# Patient Record
Sex: Female | Born: 1965
Health system: Southern US, Community
[De-identification: ages and names within clinical notes are randomized; demographics above are authoritative.]

## PROBLEM LIST (undated history)

## (undated) DIAGNOSIS — I10 Essential (primary) hypertension: Secondary | ICD-10-CM

## (undated) DIAGNOSIS — F419 Anxiety disorder, unspecified: Secondary | ICD-10-CM

## (undated) DIAGNOSIS — E282 Polycystic ovarian syndrome: Secondary | ICD-10-CM

## (undated) DIAGNOSIS — E669 Obesity, unspecified: Secondary | ICD-10-CM

## (undated) HISTORY — DX: Anxiety disorder, unspecified: F41.9

## (undated) HISTORY — DX: Polycystic ovarian syndrome: E28.2

## (undated) HISTORY — PX: ECTOPIC PREGNANCY SURGERY: SHX613

## (undated) HISTORY — DX: Obesity, unspecified: E66.9

## (undated) HISTORY — DX: Essential (primary) hypertension: I10

---

## 1997-08-05 DIAGNOSIS — E282 Polycystic ovarian syndrome: Secondary | ICD-10-CM

## 1997-08-05 HISTORY — DX: Polycystic ovarian syndrome: E28.2

## 2003-01-18 ENCOUNTER — Emergency Department (HOSPITAL_COMMUNITY): Admission: EM | Admit: 2003-01-18 | Discharge: 2003-01-18 | Payer: Self-pay | Admitting: Emergency Medicine

## 2004-09-05 ENCOUNTER — Ambulatory Visit: Payer: Self-pay | Admitting: Family Medicine

## 2004-11-28 ENCOUNTER — Other Ambulatory Visit: Admission: RE | Admit: 2004-11-28 | Discharge: 2004-11-28 | Payer: Self-pay | Admitting: Obstetrics and Gynecology

## 2005-01-21 ENCOUNTER — Ambulatory Visit (HOSPITAL_COMMUNITY): Admission: RE | Admit: 2005-01-21 | Discharge: 2005-01-21 | Payer: Self-pay | Admitting: Obstetrics and Gynecology

## 2006-02-13 ENCOUNTER — Other Ambulatory Visit: Admission: RE | Admit: 2006-02-13 | Discharge: 2006-02-13 | Payer: Self-pay | Admitting: Obstetrics and Gynecology

## 2007-08-06 ENCOUNTER — Encounter: Payer: Self-pay | Admitting: Physician Assistant

## 2008-06-27 ENCOUNTER — Observation Stay (HOSPITAL_COMMUNITY): Admission: EM | Admit: 2008-06-27 | Discharge: 2008-06-28 | Payer: Self-pay | Admitting: Emergency Medicine

## 2009-10-05 ENCOUNTER — Ambulatory Visit: Payer: Self-pay | Admitting: Family Medicine

## 2009-10-05 DIAGNOSIS — E669 Obesity, unspecified: Secondary | ICD-10-CM | POA: Insufficient documentation

## 2009-10-05 DIAGNOSIS — F438 Other reactions to severe stress: Secondary | ICD-10-CM

## 2009-10-05 DIAGNOSIS — I10 Essential (primary) hypertension: Secondary | ICD-10-CM | POA: Insufficient documentation

## 2009-10-09 ENCOUNTER — Telehealth: Payer: Self-pay | Admitting: Physician Assistant

## 2009-10-21 IMAGING — CR DG CHEST 2V
2 series · 2 of 2 positions shown · non-contrast
Comparison: No priors

CLINICAL DATA: Chest pain

CHEST - 2 VIEW

[w chest pa]
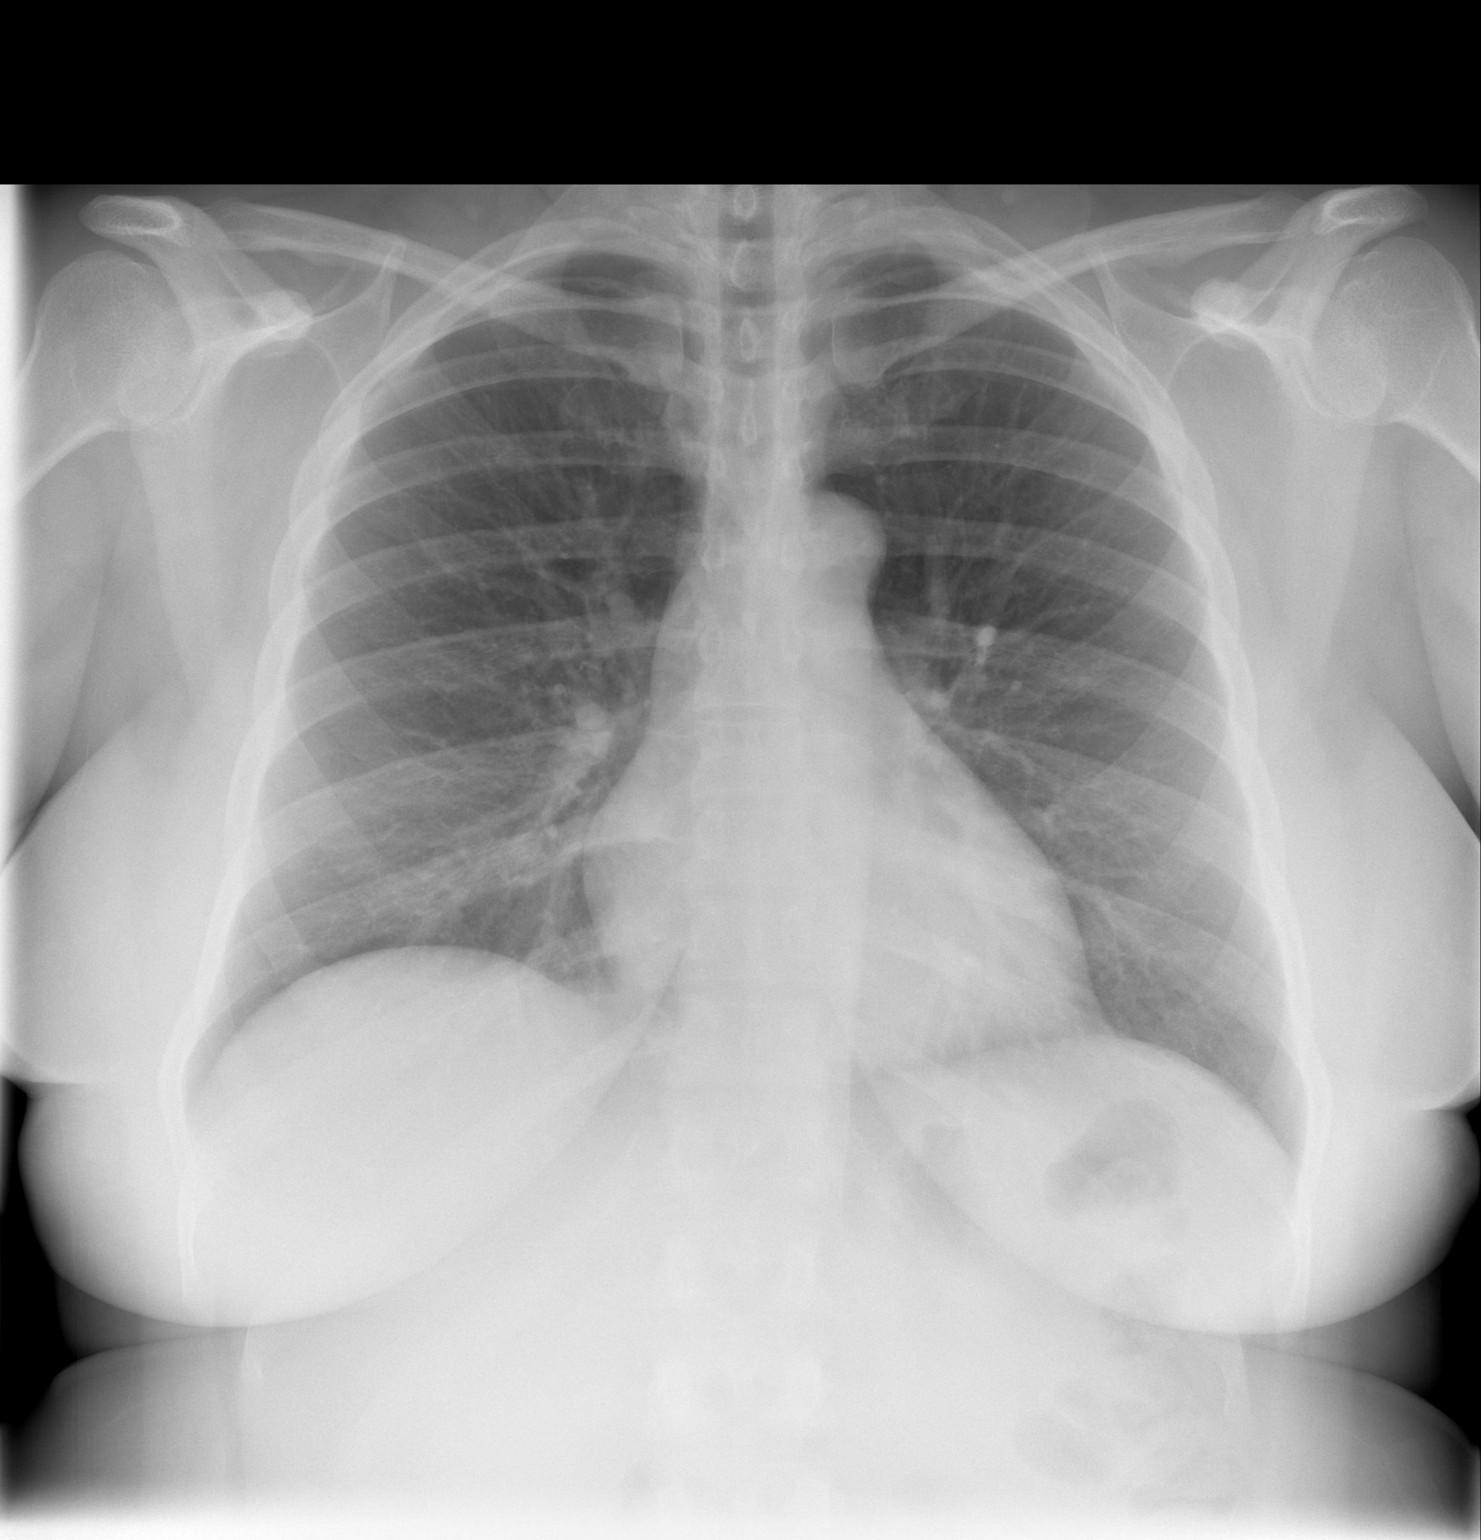

[w chest lat]
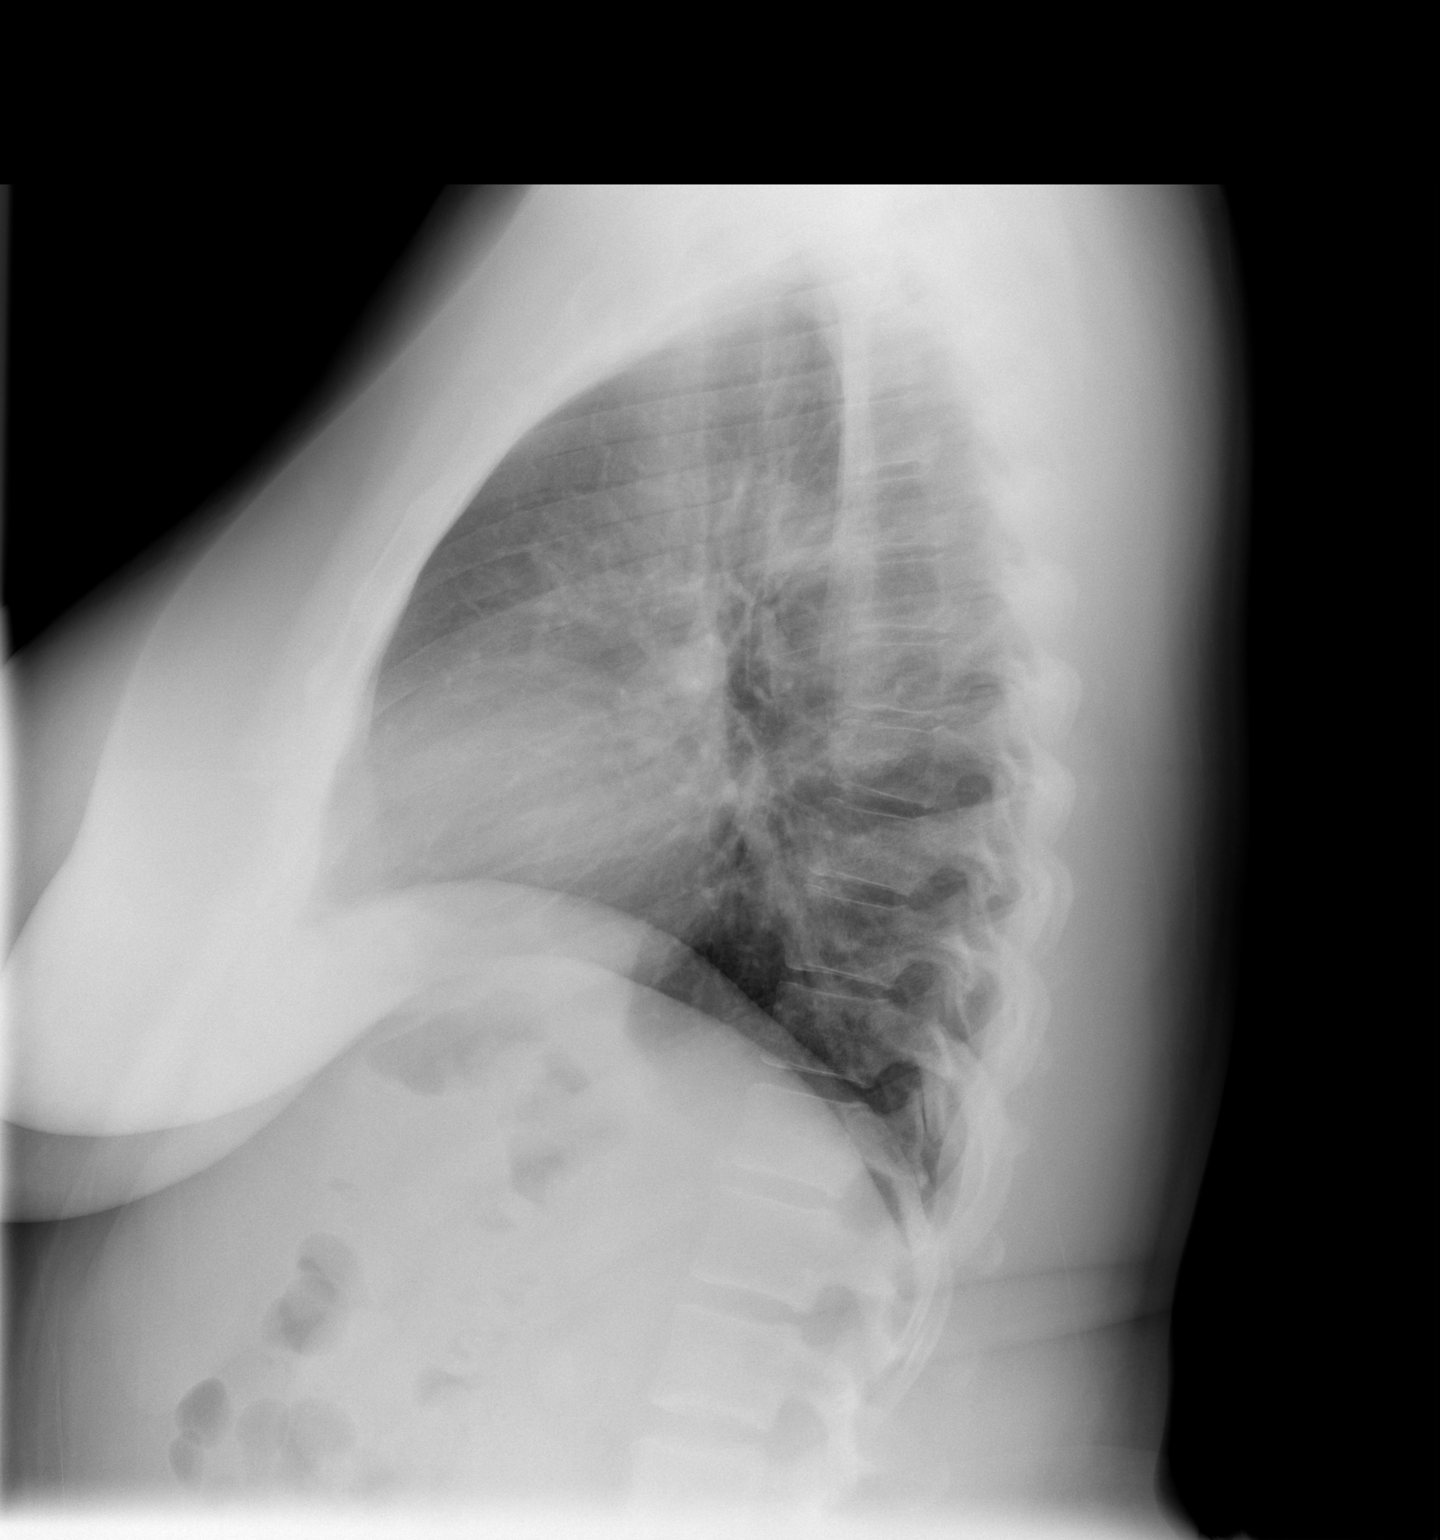

[2 of 2 positions shown; findings below may reference images not displayed]

FINDINGS: The Heart and mediastinal contours normal.  Lungs clear.
No pleural fluid.  Osseous structures and soft tissues
unremarkable.
IMPRESSION: No active disease.

## 2009-11-08 ENCOUNTER — Encounter: Payer: Self-pay | Admitting: Physician Assistant

## 2009-11-16 ENCOUNTER — Ambulatory Visit: Payer: Self-pay | Admitting: Family Medicine

## 2009-11-23 ENCOUNTER — Ambulatory Visit (HOSPITAL_COMMUNITY): Admission: RE | Admit: 2009-11-23 | Discharge: 2009-11-23 | Payer: Self-pay | Admitting: Obstetrics and Gynecology

## 2009-12-04 ENCOUNTER — Ambulatory Visit: Payer: Self-pay | Admitting: Family Medicine

## 2009-12-04 DIAGNOSIS — L255 Unspecified contact dermatitis due to plants, except food: Secondary | ICD-10-CM | POA: Insufficient documentation

## 2009-12-04 DIAGNOSIS — R609 Edema, unspecified: Secondary | ICD-10-CM | POA: Insufficient documentation

## 2010-03-26 ENCOUNTER — Ambulatory Visit: Payer: Self-pay | Admitting: Family Medicine

## 2010-04-04 ENCOUNTER — Telehealth: Payer: Self-pay | Admitting: Family Medicine

## 2010-08-26 ENCOUNTER — Encounter: Payer: Self-pay | Admitting: Obstetrics and Gynecology

## 2010-09-04 NOTE — Progress Notes (Signed)
Summary: OBGYN  OBGYN   Imported By: Lind Guest 11/28/2009 13:18:48  _____________________________________________________________________  External Attachment:    Type:   Image     Comment:   External Document

## 2010-09-04 NOTE — Assessment & Plan Note (Signed)
Summary: foot swelling- room 1   Vital Signs:  Patient profile:   44 year old female Height:      65 inches Weight:      221.75 pounds BMI:     37.03 O2 Sat:      98 % on Room air Pulse rate:   99 / minute Resp:     16 per minute BP sitting:   130 / 82  (left arm)  Vitals Entered By: Adella Hare LPN (March 26, 2010 9:06 AM)  Nutrition Counseling: Patient's BMI is greater than 25 and therefore counseled on weight management options. CC: left foot swelling off and on Comments did not bring meds to ov   CC:  left foot swelling off and on.  History of Present Illness: Pt presents today with c/o swelling bilat LE x 2-3 weeks.  Worse LLE than Rt.  Notices at end of day and is improved in the morning.  She has been trying to cut back on her salt use, but states she has been using alot of seasoning packets.  She denies prolonged sitting or standing.  No redness or pain.  Hx of htn. Taking medications as prescribed. No headache, chest pain or palpitations.  Pt has been trying to conceive.  This is on hold for approx 6 mos due to health problems with her husband.  She also asks if she can have a prescription to help with wt loss.  She had lost 5 lbs but has put most of it back on.  Was walking daily for exercise, but her walking partner quit so she did too.      Allergies (verified): No Known Drug Allergies  Past History:  Past medical history reviewed for relevance to current acute and chronic problems.  Past Medical History: Reviewed history from 10/05/2009 and no changes required. Hypertension Obesity Infertility PMH reviewed for relevance  Review of Systems General:  Denies chills and fever. CV:  Complains of swelling of feet; denies chest pain or discomfort and palpitations. Resp:  Denies shortness of breath. MS:  Denies joint pain and joint redness. Neuro:  Denies headaches.  Physical Exam  General:  Well-developed,well-nourished,in no acute distress;  alert,appropriate and cooperative throughout examination Head:  Normocephalic and atraumatic without obvious abnormalities. No apparent alopecia or balding. Ears:  External ear exam shows no significant lesions or deformities.  Otoscopic examination reveals clear canals, tympanic membranes are intact bilaterally without bulging, retraction, inflammation or discharge. Hearing is grossly normal bilaterally. Nose:  External nasal examination shows no deformity or inflammation. Nasal mucosa are pink and moist without lesions or exudates. Mouth:  Oral mucosa and oropharynx without lesions or exudates.  Teeth in good repair. Neck:  No deformities, masses, or tenderness noted. Lungs:  Normal respiratory effort, chest expands symmetrically. Lungs are clear to auscultation, no crackles or wheezes. Heart:  Normal rate and regular rhythm. S1 and S2 normal without gallop, murmur, click, rub or other extra sounds. Pulses:  R posterior tibial normal, R dorsalis pedis normal, L posterior tibial normal, and L dorsalis pedis normal.   Extremities:  No PTE Cervical Nodes:  No lymphadenopathy noted Psych:  Cognition and judgment appear intact. Alert and cooperative with normal attention span and concentration. No apparent delusions, illusions, hallucinations   Impression & Recommendations:  Problem # 1:  PERIPHERAL EDEMA (ICD-782.3) Assessment New Dependent edema.  Discussed with pt to check sodium content of the seasoning pkts she is using. Increase water intake, elevate legs etc. Offered prescription for  support stockings but pt declined.  Her updated medication list for this problem includes:    Furosemide 20 Mg Tabs (Furosemide) .Marland Kitchen... Take 1 in the morning as needed for swellling  Problem # 2:  HYPERTENSION (ICD-401.9) Assessment: Comment Only  Her updated medication list for this problem includes:    Procardia Xl 60 Mg Xr24h-tab (Nifedipine) ..... One tab by mouth once daily d/c lotrel    Furosemide  20 Mg Tabs (Furosemide) .Marland Kitchen... Take 1 in the morning as needed for swellling  BP today: 130/82 Prior BP: 120/80 (12/04/2009)  Problem # 3:  OBESITY (ICD-278.00) Assessment: Comment Only  Ht: 65 (03/26/2010)   Wt: 221.75 (03/26/2010)   BMI: 37.03 (03/26/2010)  Complete Medication List: 1)  Procardia Xl 60 Mg Xr24h-tab (Nifedipine) .... One tab by mouth once daily d/c lotrel 2)  Prenavite Multiple Vitamin 28-0.8 Mg Tabs (Prenatal vit-fe fumarate-fa) .... One tab by mouth once daily 3)  Furosemide 20 Mg Tabs (Furosemide) .... Take 1 in the morning as needed for swellling 4)  Phentermine Hcl 37.5 Mg Caps (Phentermine hcl) .... Take 1 daily 5)  Metformin Hcl 500 Mg Tabs (Metformin hcl) .... Take 1/2 tab tid  Patient Instructions: 1)  Please schedule a follow-up appointment in 1 month. 2)  It is important that you exercise regularly at least 20 minutes 5 times a week. If you develop chest pain, have severe difficulty breathing, or feel very tired , stop exercising immediately and seek medical attention. 3)  You need to lose weight. Consider a lower calorie diet and regular exercise.  4)  Continue condoms.  You should not take Phentermine once you start trying to conceive. Prescriptions: PHENTERMINE HCL 37.5 MG CAPS (PHENTERMINE HCL) take 1 daily  #30 x 0   Entered and Authorized by:   Esperanza Sheets PA   Signed by:   Esperanza Sheets PA on 03/26/2010   Method used:   Printed then faxed to ...       [facility] (retail)       [address]       [place], [place]  [address]       Ph: [phone]       Fax: [phone]   RxID:   [id number] FUROSEMIDE 20 MG TABS (FUROSEMIDE) take 1 in the morning as needed for swellling  #30 x 0   Entered and Authorized by:   Esperanza Sheets PA   Signed by:   Esperanza Sheets PA on 03/26/2010   Method used:   Electronically to        [facility] (retail)       [address]       [place], [place]  [address]       Ph: [phone]       Fax: [phone]   RxID:   [id number]

## 2010-09-04 NOTE — Assessment & Plan Note (Signed)
Summary: follow up- room 3   Vital Signs:  Patient profile:   45 year old female Height:      65 inches Weight:      220 pounds BMI:     36.74 O2 Sat:      98 % on Room air Pulse rate:   97 / minute Resp:     16 per minute BP sitting:   120 / 70  (left arm)  Vitals Entered By: Adella Hare LPN (November 16, 2009 8:59 AM)  Nutrition Counseling: Patient's BMI is greater than 25 and therefore counseled on weight management options. CC: follow-up visit Is Patient Diabetic? No Pain Assessment Patient in pain? no        CC:  follow-up visit.  History of Present Illness: Hx of htn. Taking medications as prescribed. Recently changed BP meds to Nifedipine. No headache, chest pain or palpitations. Walking 30 - 45 mins every morning since last visit.  Hx of anxiety.  States is currently doing well with this & is controlled.  No meds.  Saw gynecologist recently.  Had pap/physical done.  Is being referred to infertility specialist.  Has been trying to conceive.  Pt states she had labs done thru GYN. Gyn prescribed prenatal vits.  No eye exam x yrs + dental care Uncertain of last Tetnus.   Current Medications (verified): 1)  Procardia Xl 60 Mg Xr24h-Tab (Nifedipine) .... One Tab By Mouth Once Daily D/c Lotrel 2)  Prenavite Multiple Vitamin 28-0.8 Mg Tabs (Prenatal Vit-Fe Fumarate-Fa) .... One Tab By Mouth Once Daily  Allergies (verified): No Known Drug Allergies  Past History:  Past medical, surgical, family and social histories (including risk factors) reviewed for relevance to current acute and chronic problems.  Past Medical History: Reviewed history from 10/05/2009 and no changes required. Hypertension Obesity Infertility  Past Surgical History: Reviewed history from 10/05/2009 and no changes required. Tubal pregnancy 1980's Oral extractions  Family History: Reviewed history from 10/05/2009 and no changes required. mother living- COPD, DM, CHF father living- HTN,  gout brother living- presumed healthy  Social History: Reviewed history from 10/05/2009 and no changes required. Unemployed Married- 13 years no children Never Smoked Alcohol use-no Drug use-no Regular exercise-no  Review of Systems General:  Denies fatigue. CV:  Denies chest pain or discomfort and palpitations. Resp:  Denies shortness of breath. GI:  Complains of indigestion; denies change in bowel habits, nausea, and vomiting; INDIGESTION WITH CERTAIN FOODS.  Physical Exam  General:  Well-developed,well-nourished,in no acute distress; alert,appropriate and cooperative throughout examination Head:  Normocephalic and atraumatic without obvious abnormalities. No apparent alopecia or balding. Eyes:  pupils equal, pupils round, pupils reactive to light, pupils react to accomodation, corneas and lenses clear, no injection, no optic disk abnormalities, and no retinal abnormalitiies.   Ears:  External ear exam shows no significant lesions or deformities.  Otoscopic examination reveals clear canals, tympanic membranes are intact bilaterally without bulging, retraction, inflammation or discharge. Hearing is grossly normal bilaterally. Nose:  External nasal examination shows no deformity or inflammation. Nasal mucosa are pink and moist without lesions or exudates. Mouth:  Oral mucosa and oropharynx without lesions or exudates.  Teeth in good repair. Neck:  No deformities, masses, or tenderness noted. Lungs:  Normal respiratory effort, chest expands symmetrically. Lungs are clear to auscultation, no crackles or wheezes. Heart:  Normal rate and regular rhythm. S1 and S2 normal without gallop, murmur, click, rub or other extra sounds. Cervical Nodes:  No lymphadenopathy noted Psych:  Cognition  and judgment appear intact. Alert and cooperative with normal attention span and concentration. No apparent delusions, illusions, hallucinations   Impression & Recommendations:  Problem # 1:   HYPERTENSION (ICD-401.9) Assessment Improved Continue current meds.    Her updated medication list for this problem includes:    Procardia Xl 60 Mg Xr24h-tab (Nifedipine) ..... One tab by mouth once daily d/c lotrel  Problem # 2:  OBESITY (ICD-278.00) Assessment: Deteriorated Discussed healthy diet, & continue exercise. Diet h/o given.  Ht: 65 (11/16/2009)   Wt: 220 (11/16/2009)   BMI: 36.74 (11/16/2009)  Problem # 3:  ANXIETY, SITUATIONAL (ICD-308.3) Assessment: Improved  Complete Medication List: 1)  Procardia Xl 60 Mg Xr24h-tab (Nifedipine) .... One tab by mouth once daily d/c lotrel 2)  Prenavite Multiple Vitamin 28-0.8 Mg Tabs (Prenatal vit-fe fumarate-fa) .... One tab by mouth once daily  Other Orders: Tdap => 42yrs IM (09811) Admin 1st Vaccine (91478)  Patient Instructions: 1)  Please schedule a follow-up appointment in 4 months. 2)  It is important that you exercise regularly at least 20 minutes 5 times a week. If you develop chest pain, have severe difficulty breathing, or feel very tired , stop exercising immediately and seek medical attention. 3)  You need to lose weight. Consider a lower calorie diet and regular exercise.  4)  I recommend you schedule a routine eye exam. 5)  Continue your current blood pressure medication.  Your blood pressure is very good today. Prescriptions: PROCARDIA XL 60 MG XR24H-TAB (NIFEDIPINE) one tab by mouth once daily d/c lotrel  #30 x 3   Entered and Authorized by:   Esperanza Sheets PA   Signed by:   Esperanza Sheets PA on 11/16/2009   Method used:   Electronically to        [facility] (retail)       [address]       [place], [place]  [address]       Ph: [phone]       Fax: [phone]   RxID:   [id number]    Immunizations Administered:  Tetanus Vaccine:    Vaccine Type: Tdap    Site: left deltoid    Mfr: San[employer]i Pasteur    Dose: 0.5 ml    Route: IM    Given by: Adella Hare LPN     Exp. Date: 10/28/2011    Lot #: [id number]    VIS given: 06/23/07 version given November 17, 2009.

## 2010-09-04 NOTE — Progress Notes (Signed)
Summary: autherzation on meds  Phone Note Call from Patient   Summary of Call: pt says her rx needs preautherzation. pharm faxed it over.  [phone] Initial call taken by: Rudene Anda,  April 04, 2010 3:30 PM  Follow-up for Phone Call        patient aware that insurance does not pay for diet meds Follow-up by: Adella Hare LPN,  April 05, 2010 12:13 PM

## 2010-09-04 NOTE — Letter (Signed)
Summary: rpc chart  rpc chart   Imported By: Curtis Sites 05/07/2010 09:47:43  _____________________________________________________________________  External Attachment:    Type:   Image     Comment:   External Document

## 2010-09-04 NOTE — Assessment & Plan Note (Signed)
Summary: new patient - room 2   Vital Signs:  Patient profile:   45 year old female Height:      65 inches Weight:      215.25 pounds BMI:     35.95 O2 Sat:      98 % Pulse rate:   109 / minute Resp:     16 per minute BP sitting:   140 / 80  (left arm)  Vitals Entered By: Adella Hare LPN (October 05, 1608 9:17 AM)  Nutrition Counseling: Patient's BMI is greater than 25 and therefore counseled on weight management options.  Serial Vital Signs/Assessments:  Time      Position  BP       Pulse  Resp  Temp     By                     126/92                         Esperanza Sheets PA  CC: new patient Is Patient Diabetic? No Pain Assessment Patient in pain? no        CC:  new patient.  History of Present Illness: New pt here to establish care with a new PCP.  Had been out of ins for about 1 yr so hasn't had medical care during that time.  I am uncertain & she is unclear about who has been prescribing her BP meds.  She does state that she had been "stretching them out"  only taking them every few days up until recently.  She is taking them daily now.  She doesn't like taking 2 different pills though & would like her Rx changed so that she is taking only 1.  She denies chest pain, palp, or swelling.   Pt was having alot of anxiety issues. She states this was due to her previous job.  It was a very stressful environment.  Since she was layed off she has been doing well.  No feelings of depression, SI & is sleeping well.     Current Medications (verified): 1)  Amlodipine Besylate 5 Mg Tabs (Amlodipine Besylate) .... One Tab By Mouth Once Daily 2)  Hydrochlorothiazide 12.5 Mg Caps (Hydrochlorothiazide) .... One Cap By Mouth Once Daily  Allergies (verified): No Known Drug Allergies  Past History:  Past medical, surgical, family and social histories (including risk factors) reviewed for relevance to current acute and chronic problems.  Past Medical  History: Hypertension Obesity Infertility  Past Surgical History: Tubal pregnancy 1980's Oral extractions  Family History: Reviewed history and no changes required. mother living- COPD, DM, CHF father living- HTN, gout brother living- presumed healthy  Social History: Reviewed history and no changes required. Unemployed Married- 13 years no children Never Smoked Alcohol use-no Drug use-no Regular exercise-no Smoking Status:  never Drug Use:  no Does Patient Exercise:  no  Review of Systems General:  Denies chills and fever. Eyes:  Denies blurring and double vision. ENT:  Denies earache, sinus pressure, and sore throat. CV:  Denies chest pain or discomfort and swelling of feet. Resp:  Denies cough and shortness of breath. GI:  Denies indigestion, nausea, and vomiting. Psych:  Denies anxiety, depression, and panic attacks. Endo:  Denies excessive thirst and polyuria. Heme:  Denies enlarge lymph nodes and fevers.  Physical Exam  General:  Well-developed,well-nourished,in no acute distress; alert,appropriate and cooperative throughout examination Head:  Normocephalic and atraumatic without obvious abnormalities.  No apparent alopecia or balding. Ears:  External ear exam shows no significant lesions or deformities.  Otoscopic examination reveals clear canals, tympanic membranes are intact bilaterally without bulging, retraction, inflammation or discharge. Hearing is grossly normal bilaterally. Nose:  External nasal examination shows no deformity or inflammation. Nasal mucosa are pink and moist without lesions or exudates. Mouth:  Oral mucosa and oropharynx without lesions or exudates.  Teeth in good repair. Neck:  No deformities, masses, or tenderness noted. Lungs:  Normal respiratory effort, chest expands symmetrically. Lungs are clear to auscultation, no crackles or wheezes. Heart:  Normal rate and regular rhythm. S1 and S2 normal without gallop, murmur, click, rub or  other extra sounds. Abdomen:  Bowel sounds positive,abdomen soft and non-tender without masses, organomegaly or hernias noted. Extremities:  No clubbing, cyanosis, edema, or deformity noted with normal full range of motion of all joints.   Cervical Nodes:  No lymphadenopathy noted Psych:  Cognition and judgment appear intact. Alert and cooperative with normal attention span and concentration. No apparent delusions, illusions, hallucinations   Impression & Recommendations:  Problem # 1:  HYPERTENSION (ICD-401.9) Discussed with pt we could d/c the Hctz & increase her Amlodipine so that she would only have 1 pill a day.  Side effect risk of edema discussed. Pt does not want to do this, so Rx'd changed to Lotrel.  The following medications were removed from the medication list:    Amlodipine Besylate 5 Mg Tabs (Amlodipine besylate) ..... One tab by mouth once daily    Hydrochlorothiazide 12.5 Mg Caps (Hydrochlorothiazide) ..... One cap by mouth once daily Her updated medication list for this problem includes:    Lotrel 5-10 Mg Caps (Amlodipine besy-benazepril hcl) .Marland Kitchen... 1 daily  Orders: T-Comprehensive Metabolic Panel [id number]) T-Lipid Profile [id number]) T-CBC No Diff ([id number])  Problem # 2:  OBESITY (ICD-278.00)  Wt: 215.25 (10/05/2009)     Orders: T-Comprehensive Metabolic Panel ([id number]) T-Lipid Profile ([id number]) T-TSH ([id number])  Problem # 3:  ANXIETY, SITUATIONAL (ICD-308.3) Assessment: Improved Hx of, no current probs with.  Complete Medication List: 1)  Lotrel 5-10 Mg Caps (Amlodipine besy-benazepril hcl) .Marland Kitchen.. 1 daily  Other Orders: T-Vitamin D (25-Hydroxy) ([id number])  Patient Instructions: 1)  Schedule appt in 4-6 weeks for physical/pap. 2)  I have ordered  blood work for you.  Please have done fasting. 3)  I have changed your blood pressure pills.  discontinue taking amlodipine & hctz.  You will now just be taking Lotrel. 4)  It is important  that you exercise regularly at least 20 minutes 5 times a week. If you develop chest pain, have severe difficulty breathing, or feel very tired , stop exercising immediately and seek medical attention. 5)  You need to lose weight. Consider a lower calorie diet and regular exercise.  Prescriptions: LOTREL 5-10 MG CAPS (AMLODIPINE BESY-BENAZEPRIL HCL) 1 daily  #30 x 1   Entered and Authorized by:   Esperanza Sheets PA   Signed by:   Esperanza Sheets PA on 10/05/2009   Method used:   Electronically to        [facility] (retail)       [address]       [place], [place]  [address]       Ph: [phone]       Fax: [phone]   RxID:   [id number]

## 2010-09-04 NOTE — Assessment & Plan Note (Signed)
Summary: rash and swelling- room 1   Vital Signs:  Patient profile:   45 year old female Height:      65 inches Weight:      219.50 pounds BMI:     36.66 O2 Sat:      99 % on Room air Pulse rate:   111 / minute Resp:     16 per minute BP sitting:   120 / 80  (left arm)  Vitals Entered By: Adella Hare LPN (Dec 04, 1608 2:01 PM)  Nutrition Counseling: Patient's BMI is greater than 25 and therefore counseled on weight management options. CC: rash and swelling on ankles and leg Is Patient Diabetic? No Pain Assessment Patient in pain? no      Comments patient did not bring medication to office today   CC:  rash and swelling on ankles and leg.  History of Present Illness: Pt is here today with c/o itchy rash on her LLE x couple days.  No spreading.  Thinks she may have gotten poison ivy from the dog. She knows it is at the edge of her yard, but she has not been near it.  Has also been having some swelling in her lower legs and ankles.  She wonders if this is from the poison ivy.  No chest pain or difficulty breathing.  Is worse end of day & better in am when awakens.  She is taking her BP med daily.  States she has been eating healthier.  Eating more fruits, veggies, yogurt and less meat. Has also been walking 45 min - 1 hr daily.      Allergies (verified): No Known Drug Allergies  Past History:  Past medical history reviewed for relevance to current acute and chronic problems.  Past Medical History: Reviewed history from 10/05/2009 and no changes required. Hypertension Obesity Infertility  Review of Systems General:  Denies chills and fever. CV:  Complains of swelling of feet; denies chest pain or discomfort and palpitations. Resp:  Denies cough and shortness of breath. Derm:  Complains of rash.  Physical Exam  General:  Well-developed,well-nourished,in no acute distress; alert,appropriate and cooperative throughout examination Head:  Normocephalic and  atraumatic without obvious abnormalities. No apparent alopecia or balding. Pulses:  R posterior tibial normal, R dorsalis pedis normal, L posterior tibial normal, and L dorsalis pedis normal.   Extremities:  Trace left pretibial edema and right pretibial edema.   Neurologic:  alert & oriented X3 and gait normal.   Skin:  Lt anterior lower leg 2 linear areas of grouped erythematous papules.  No vesicles or pustular heads.  Remainder of skin clear.  Cervical Nodes:  No lymphadenopathy noted Psych:  Cognition and judgment appear intact. Alert and cooperative with normal attention span and concentration. No apparent delusions, illusions, hallucinations   Impression & Recommendations:  Problem # 1:  RHUS DERMATITIS (ICD-692.6) Assessment New  Her updated medication list for this problem includes:    Betamethasone Dipropionate 0.05 % Crea (Betamethasone dipropionate) .Marland Kitchen... Apply two times a day to rash  Orders: Depo- Medrol 80mg  (J1040) Admin of Therapeutic Inj  intramuscular or subcutaneous (96045)  Problem # 2:  PERIPHERAL EDEMA (ICD-782.3) Assessment: New Discussed low sodium diet, elevating legs, & support hose.  Problem # 3:  HYPERTENSION (ICD-401.9) Assessment: Comment Only  Her updated medication list for this problem includes:    Procardia Xl 60 Mg Xr24h-tab (Nifedipine) ..... One tab by mouth once daily d/c lotrel  BP today: 120/80 Prior BP: 120/70 (  11/16/2009)  Complete Medication List: 1)  Procardia Xl 60 Mg Xr24h-tab (Nifedipine) .... One tab by mouth once daily d/c lotrel 2)  Prenavite Multiple Vitamin 28-0.8 Mg Tabs (Prenatal vit-fe fumarate-fa) .... One tab by mouth once daily 3)  Betamethasone Dipropionate 0.05 % Crea (Betamethasone dipropionate) .... Apply two times a day to rash  Patient Instructions: 1)  Please schedule a follow-up appointment as needed. 2)  It is important that you exercise regularly at least 20 minutes 5 times a week. If you develop chest  pain, have severe difficulty breathing, or feel very tired , stop exercising immediately and seek medical attention. 3)  You need to lose weight. Consider a lower calorie diet and regular exercise.  4)  You have received Depo Medrol today to help with your rash. 5)  I have prescribed a cream to put on your rash two times a day. 6)  Limit your Sodium (Salt) to less than 2 grams a day(slightly less than 1/2 a teaspoon) to prevent fluid retention, swelling, or worsening of symptoms. Prescriptions: PROCARDIA XL 60 MG XR24H-TAB (NIFEDIPINE) one tab by mouth once daily d/c lotrel  #30 Tablet x 5   Entered and Authorized by:   Esperanza Sheets PA   Signed by:   Esperanza Sheets PA on 12/04/2009   Method used:   Electronically to        [facility] (retail)       [address]       [place], [place]  [address]       Ph: [phone]       Fax: [phone]   RxID:   [id number] BETAMETHASONE DIPROPIONATE 0.05 % CREA (BETAMETHASONE DIPROPIONATE) apply two times a day to rash  #15 grams x 0   Entered and Authorized by:   Esperanza Sheets PA   Signed by:   Esperanza Sheets PA on 12/04/2009   Method used:   Electronically to        [facility] (retail)       [address]       [place], [place]  [address]       Ph: [phone]       Fax: [phone]   RxID:   [id number]    Medication Administration  Injection # 1:    Medication: Depo- Medrol 80mg     Diagnosis: RHUS DERMATITIS (ICD-692.6)    Route: IM    Site: LUOQ gluteus    Exp Date: 12/11    Lot #: [id number]    Mfr: Pharmacia    Patient tolerated injection without complications    Given by: Adella Hare LPN (Dec 05, 8839 3:03 PM)  Orders Added: 1)  Est. Patient Level IV [66063] 2)  Depo- Medrol 80mg  [J1040] 3)  Admin of Therapeutic Inj  intramuscular or subcutaneous [01601]

## 2010-09-04 NOTE — Progress Notes (Signed)
Summary: BP MEDS  Phone Note Call from Patient   Summary of Call: NEEDS SOMEONE TO CALL HER ABOUT HER BP MEDS.  CALL BACK AT [phone] Initial call taken by: Lind Guest,  October 09, 2009 9:31 AM  Follow-up for Phone Call        states her insert for bp med states if pregnant or trying to get pregnant shouldnt take pill. patient reports she is trying to get pregnant so she needs another med. Follow-up by: Adella Hare LPN,  October 09, 2009 9:35 AM  Additional Follow-up for Phone Call Additional follow up Details #1::        D/C Lotrel. Change to Procardia XL (Nifedipine) 60 mg 1 daily #30 x 1 Rf Additional Follow-up by: Esperanza Sheets PA,  October 09, 2009 4:46 PM    Additional Follow-up for Phone Call Additional follow up Details #2::    rx sent patient aware Follow-up by: Adella Hare LPN,  October 09, 2009 4:50 PM  New/Updated Medications: PROCARDIA XL 60 MG XR24H-TAB (NIFEDIPINE) one tab by mouth once daily PROCARDIA XL 60 MG XR24H-TAB (NIFEDIPINE) one tab by mouth once daily d/c lotrel Prescriptions: PROCARDIA XL 60 MG XR24H-TAB (NIFEDIPINE) one tab by mouth once daily d/c lotrel  #30 x 1   Entered by:   Adella Hare LPN   Authorized by:   Esperanza Sheets PA   Signed by:   Adella Hare LPN on 95/28/4132   Method used:   Electronically to        [facility] (retail)       [address]       [place], [place]  [address]       Ph: [phone]       Fax: [phone]   RxID:   [id number] PROCARDIA XL 60 MG XR24H-TAB (NIFEDIPINE) one tab by mouth once daily  #30 x 1   Entered by:   Adella Hare LPN   Authorized by:   Esperanza Sheets PA   Signed by:   Adella Hare LPN on 60/63/0160   Method used:   Electronically to        [facility] (retail)       [address]       [place], [place]  [address]       Ph: [phone]       Fax: [phone]   RxID:   [id number]

## 2010-12-06 ENCOUNTER — Telehealth: Payer: Self-pay | Admitting: Physician Assistant

## 2010-12-18 NOTE — H&P (Signed)
Amy Frazier, Amy Frazier              ACCOUNT NO.:  [id number]   MEDICAL RECORD NO.:  [id number]          PATIENT TYPE:  OBV   LOCATION:  1828                         FACILITY:  [facility]   PHYSICIAN:  Kela Millin, M.D.DATE OF BIRTH:  1966-03-01   DATE OF ADMISSION:  06/27/2008  DATE OF DISCHARGE:                              HISTORY & PHYSICAL   CHIEF COMPLAINT:  Chest pain.   HISTORY OF PRESENT ILLNESS:  The patient is an obese 45 year old black  female with a past medical history of hypertension on no medications -  she was on medications briefly but stopped taking them about a year ago,  and presents with the above complaints.  She states that she noticed  that she was having some chest pain after waking up this morning.  She  describes the pain as stabbing, intermittent, lasting a couple of  minutes at the time, but there was also another component - pressure  that was constant.  She states that the pain was 2-3/10 in intensity.  She took some aspirin that she had and went to work.  Because the pain  was persisting, she came to the ER.  [name] states that the stabbing  pain had resolved prior to her coming to the ER, but that the pressure  was present until after she received nitroglycerin in the ER.  She was  pain free at the time of my interview in the ER.   In the ER, a chest x-ray was done which showed no active disease.  Point  of care markers were negative, and her EKG revealed no acute ischemic  changes.  She is admitted for further evaluation and management.   PAST MEDICAL HISTORY:  As above.   MEDICATIONS:  None.   ALLERGIES:  No known drug allergies.   SOCIAL HISTORY:  Denies tobacco.  She also denies alcohol.   FAMILY HISTORY:  Her mother has diabetes and congestive heart failure,  as well as COPD (a smoker).  Her aunt had coronary artery disease and is  status post CABG.   REVIEW OF SYSTEMS:  As per HPI.  Other review of systems negative.   PHYSICAL  EXAMINATION:  GENERAL:  The patient is an obese, middle-aged  black female in no apparent distress.  VITAL SIGNS:  Her temperature is 98.1, blood pressure 126/72; initially  154/100, pulse 88, respiratory rate of 24, O2 saturation of 98%.  HEENT:  Pupils are equal, round and reactive to light.  Extraocular  movements intact.  Sclerae anicteric.  Moist mucous membranes.  NECK:  Supple, no adenopathy, no thyromegaly and no JVD.  LUNGS:  Clear to auscultation bilaterally.  No crackles and no wheezes.  CARDIOVASCULAR:  Regular rate and rhythm.  Normal S1-S2.  No S3  appreciated.  ABDOMEN:  Obese.  Bowel sounds present.  Nontender, nondistended.  No  organomegaly and no masses palpable.  EXTREMITIES:  No cyanosis and no edema.  NEUROLOGIC:  She is alert and oriented x3.  Cranial nerves II-XII  grossly intact.  Nonfocal exam.   LABORATORY DATA:  As per HPI.  Also, her  white cell count is 5.7,  hemoglobin of 13.2, hematocrit of 38.6, platelet count of 278.  Sodium  is 141 with a potassium of 3.7, chloride 104, BUN of 8, creatinine is  0.9, glucose 96, ionized calcium is 1.23.   ASSESSMENT AND PLAN:  1. Chest pain - as discussed above, will obtain serial cardiac      enzymes, also a D-dimer and follow.  Will place the patient on      aspirin, nitroglycerin, as well as Protonix.  Obtain a fasting      lipid profile, as well, and follow.  2. Hypertension - initial blood pressures elevated, but on recheck      within normal limits.  It is noted that she previously had been on      medications but stopped about a year ago.  Will monitor and treat      accordingly.      Kela Millin, M.D.  Electronically Signed     ACV/MEDQ  D:  06/27/2008  T:  06/27/2008  Job:  [id number]   cc:   Stacie Acres. Cliffton Asters, M.D.

## 2010-12-20 ENCOUNTER — Other Ambulatory Visit: Payer: Self-pay

## 2010-12-20 MED ORDER — NIFEDIPINE 60 MG (OSM) PO TB24: 60.0000 mg | ORAL_TABLET | Freq: Every day | ORAL | Status: DC

## 2011-01-03 NOTE — Telephone Encounter (Signed)
Patient went to urgent care or ed

## 2011-01-25 ENCOUNTER — Other Ambulatory Visit (HOSPITAL_COMMUNITY): Payer: Self-pay | Admitting: Obstetrics and Gynecology

## 2011-01-25 DIAGNOSIS — Z1231 Encounter for screening mammogram for malignant neoplasm of breast: Secondary | ICD-10-CM

## 2011-02-05 ENCOUNTER — Encounter: Payer: Self-pay | Admitting: Family Medicine

## 2011-02-05 ENCOUNTER — Ambulatory Visit (INDEPENDENT_AMBULATORY_CARE_PROVIDER_SITE_OTHER): Payer: BC Managed Care – PPO | Admitting: Family Medicine

## 2011-02-05 ENCOUNTER — Encounter: Payer: Self-pay | Admitting: Physician Assistant

## 2011-02-05 VITALS — BP 140/86 | HR 85 | Resp 16 | Ht 65.0 in | Wt 216.1 lb

## 2011-02-05 DIAGNOSIS — E282 Polycystic ovarian syndrome: Secondary | ICD-10-CM

## 2011-02-05 DIAGNOSIS — E669 Obesity, unspecified: Secondary | ICD-10-CM

## 2011-02-05 DIAGNOSIS — I10 Essential (primary) hypertension: Secondary | ICD-10-CM

## 2011-02-05 MED ORDER — NIFEDIPINE 60 MG (OSM) PO TB24: 60.0000 mg | ORAL_TABLET | Freq: Every day | ORAL | Status: DC

## 2011-02-05 NOTE — Assessment & Plan Note (Signed)
Medication compliance addressed. Commitment to regular exercise and healthy  food choices, with portion control discussed. DASH diet and low fat diet discussed and literature offered. Changes in medication made at this visit.  

## 2011-02-05 NOTE — Patient Instructions (Addendum)
F/u in 4 months.  It is important that you exercise regularly at least 30 minutes 5 times a week. If you develop chest pain, have severe difficulty breathing, or feel very tired, stop exercising immediately and seek medical attention  A healthy diet is rich in fruit, vegetables and whole grains. Poultry fish, nuts and beans are a healthy choice for protein rather then red meat. A low sodium diet and drinking 64 ounces of water daily is generally recommended. Oils and sweet should be limited. Carbohydrates especially for those who are diabetic or overweight, should be limited to 30-45 gram per meal. It is important to eat on a regular schedule, at least 3 times daily. Snacks should be primarily fruits, vegetables or nuts.  Goal of 10 pound weight loss

## 2011-02-07 ENCOUNTER — Ambulatory Visit (HOSPITAL_COMMUNITY): Payer: Self-pay

## 2011-02-07 DIAGNOSIS — E282 Polycystic ovarian syndrome: Secondary | ICD-10-CM | POA: Insufficient documentation

## 2011-02-07 NOTE — Assessment & Plan Note (Signed)
Followed by gynae, will soon be undergoing in vitro fertilization , in the hope that she will carry a full term pregnancy, has stored eggs, wants twins, spouse completing treatment for Hep C

## 2011-02-07 NOTE — Progress Notes (Signed)
  Subjective:    Patient ID: Amy Frazier, female    DOB: 04-21-1966, 45 y.o.   MRN: [id number]  HPI The PT is here for follow up and re-evaluation of chronic medical conditions, medication management and review of recent lab and radiology data.  Preventive health is updated, specifically  Cancer screening,  and Immunization.   Questions or concerns regarding consultations or procedures which the PT has had in the interim are  addressed. The PT denies any adverse reactions to current medications since the last visit.  There are no new concerns. Just anxiously awaiting a time when she can have in vitro fertilization , she has PCO There are no specific complaints, no regular exercise routine, understands the need to start, also to lose weight        Review of Systems Denies recent fever or chills. Denies sinus pressure, nasal congestion, ear pain or sore throat. Denies chest congestion, productive cough or wheezing. Denies chest pains, palpitations, paroxysmal nocturnal dyspnea, orthopnea and leg swelling Denies abdominal pain, nausea, vomiting,diarrhea or constipation.   Denies dysuria, frequency, hesitancy or incontinence. Denies joint pain, swelling and limitation in mobility. Denies headaches, seizure, numbness, or tingling. Denies depression, anxiety or insomnia. Denies skin break down or rash.        Objective:   Physical Exam Patient alert and oriented and in no Cardiopulmonary distress.  HEENT: No facial asymmetry, EOMI, no sinus tenderness, TM's clear, Oropharynx pink and moist.  Neck supple no adenopathy.  Chest: Clear to auscultation bilaterally.  CVS: S1, S2 no murmurs, no S3.  ABD: Soft non tender. Bowel sounds normal.  Ext: No edema  MS: Adequate ROM spine, shoulders, hips and knees.  Skin: Intact, no ulcerations or rash noted.  Psych: Good eye contact, normal affect. Memory intact not anxious or depressed appearing.  CNS: CN 2-12 intact, power, tone  and sensation normal throughout.        Assessment & Plan:

## 2011-02-07 NOTE — Assessment & Plan Note (Signed)
Unchanged, lifestyle change encouraged and discussed to facilitate weight loss 

## 2011-02-25 ENCOUNTER — Telehealth: Payer: Self-pay | Admitting: Family Medicine

## 2011-02-25 DIAGNOSIS — Z139 Encounter for screening, unspecified: Secondary | ICD-10-CM

## 2011-02-25 DIAGNOSIS — R5381 Other malaise: Secondary | ICD-10-CM

## 2011-02-25 DIAGNOSIS — I10 Essential (primary) hypertension: Secondary | ICD-10-CM

## 2011-02-25 DIAGNOSIS — Z13220 Encounter for screening for lipoid disorders: Secondary | ICD-10-CM

## 2011-02-25 DIAGNOSIS — E669 Obesity, unspecified: Secondary | ICD-10-CM

## 2011-02-25 NOTE — Telephone Encounter (Signed)
Patient aware and faxed order to the lab

## 2011-02-25 NOTE — Telephone Encounter (Signed)
pls advise pt on review, her most recent labs were 11/2009, needs rept cbc , fasting chem 7, lipid , tsh and vit D asap , pls order.Last year her cholesterol, total and bad weere high, needs to follow low fat diet  Her pap appears overdue as well. She sees gynae dr cousins for that

## 2011-02-25 NOTE — Telephone Encounter (Signed)
Called patient left message

## 2011-02-26 ENCOUNTER — Ambulatory Visit (HOSPITAL_COMMUNITY)
Admission: RE | Admit: 2011-02-26 | Discharge: 2011-02-26 | Disposition: A | Discharge status: Home / Self Care | Payer: BC Managed Care – PPO | Source: Ambulatory Visit | Attending: Obstetrics and Gynecology | Admitting: Obstetrics and Gynecology

## 2011-02-26 DIAGNOSIS — Z1231 Encounter for screening mammogram for malignant neoplasm of breast: Secondary | ICD-10-CM | POA: Insufficient documentation

## 2011-05-07 LAB — TROPONIN I
Troponin I: 0.01
Troponin I: 0.01

## 2011-05-07 LAB — DIFFERENTIAL
Basophils Absolute: 0
Basophils Relative: 1
Eosinophils Absolute: 0
Eosinophils Relative: 1
Lymphocytes Relative: 34
Neutro Abs: 3.2

## 2011-05-07 LAB — LIPID PANEL
HDL: 31 — ABNORMAL LOW
LDL Cholesterol: 116 — ABNORMAL HIGH
Total CHOL/HDL Ratio: 6.4
VLDL: 51 — ABNORMAL HIGH

## 2011-05-07 LAB — POCT I-STAT, CHEM 8
Calcium, Ion: 1.23
HCT: 40
Hemoglobin: 13.6
Potassium: 3.7
TCO2: 25

## 2011-05-07 LAB — POCT CARDIAC MARKERS
CKMB, poc: 2.4
Troponin i, poc: <0.05

## 2011-05-07 LAB — CBC
Hemoglobin: 13.2
MCHC: 34.1
Platelets: 278

## 2011-05-07 LAB — D-DIMER, QUANTITATIVE: D-Dimer, Quant: 0.24

## 2011-05-07 LAB — CARDIAC PANEL(CRET KIN+CKTOT+MB+TROPI)
Relative Index: 0.5
Troponin I: 0.01

## 2011-05-07 LAB — CK TOTAL AND CKMB (NOT AT ARMC)
CK, MB: 2.4
Relative Index: 0.5
Total CK: 426 — ABNORMAL HIGH
Total CK: 530 — ABNORMAL HIGH

## 2011-06-05 ENCOUNTER — Encounter: Payer: Self-pay | Admitting: Physician Assistant

## 2011-06-07 ENCOUNTER — Ambulatory Visit: Payer: BC Managed Care – PPO | Admitting: Family Medicine

## 2011-07-20 ENCOUNTER — Other Ambulatory Visit: Payer: Self-pay | Admitting: Family Medicine

## 2011-08-02 ENCOUNTER — Telehealth: Payer: Self-pay | Admitting: Physician Assistant

## 2011-08-02 MED ORDER — NIFEDIPINE ER OSMOTIC RELEASE 60 MG PO TB24: ORAL_TABLET | ORAL | Status: DC

## 2011-08-02 NOTE — Telephone Encounter (Signed)
Refilled to [facility]

## 2011-11-05 ENCOUNTER — Other Ambulatory Visit: Payer: Self-pay | Admitting: Family Medicine

## 2012-07-07 ENCOUNTER — Encounter: Payer: Self-pay | Admitting: Family Medicine

## 2012-07-07 ENCOUNTER — Ambulatory Visit (HOSPITAL_COMMUNITY)
Admission: RE | Admit: 2012-07-07 | Discharge: 2012-07-07 | Disposition: A | Discharge status: Home / Self Care | Payer: BC Managed Care – PPO | Source: Ambulatory Visit | Attending: Family Medicine | Admitting: Family Medicine

## 2012-07-07 ENCOUNTER — Ambulatory Visit (INDEPENDENT_AMBULATORY_CARE_PROVIDER_SITE_OTHER): Payer: BC Managed Care – PPO | Admitting: Family Medicine

## 2012-07-07 VITALS — BP 160/100 | HR 94 | Resp 16 | Ht 65.0 in | Wt 213.4 lb

## 2012-07-07 DIAGNOSIS — J40 Bronchitis, not specified as acute or chronic: Secondary | ICD-10-CM | POA: Insufficient documentation

## 2012-07-07 DIAGNOSIS — J019 Acute sinusitis, unspecified: Secondary | ICD-10-CM

## 2012-07-07 DIAGNOSIS — R5381 Other malaise: Secondary | ICD-10-CM

## 2012-07-07 DIAGNOSIS — J209 Acute bronchitis, unspecified: Secondary | ICD-10-CM

## 2012-07-07 DIAGNOSIS — R059 Cough, unspecified: Secondary | ICD-10-CM | POA: Insufficient documentation

## 2012-07-07 DIAGNOSIS — I10 Essential (primary) hypertension: Secondary | ICD-10-CM

## 2012-07-07 DIAGNOSIS — R5383 Other fatigue: Secondary | ICD-10-CM

## 2012-07-07 DIAGNOSIS — R05 Cough: Secondary | ICD-10-CM | POA: Insufficient documentation

## 2012-07-07 DIAGNOSIS — Z139 Encounter for screening, unspecified: Secondary | ICD-10-CM

## 2012-07-07 DIAGNOSIS — E669 Obesity, unspecified: Secondary | ICD-10-CM

## 2012-07-07 LAB — CBC WITH DIFFERENTIAL/PLATELET
Basophils Absolute: 0.1 10*3/uL (ref 0.0–0.1)
Basophils Relative: 1 % (ref 0–1)
Eosinophils Absolute: 0.1 10*3/uL (ref 0.0–0.7)
Lymphocytes Relative: 48 % — ABNORMAL HIGH (ref 12–46)
Lymphs Abs: 3.6 10*3/uL (ref 0.7–4.0)
MCV: 87.5 fL (ref 78.0–100.0)
Neutro Abs: 3.2 10*3/uL (ref 1.7–7.7)
Neutrophils Relative %: 41 % — ABNORMAL LOW (ref 43–77)
Platelets: 317 10*3/uL (ref 150–400)
RBC: 4.31 MIL/uL (ref 3.87–5.11)
WBC: 7.5 10*3/uL (ref 4.0–10.5)

## 2012-07-07 IMAGING — CR DG CHEST 2V
2 series · 2 of 2 positions shown · non-contrast
Comparison: None

CLINICAL DATA: Bronchitis, cough, hypertension

CHEST - 2 VIEW

[view not recorded (1 of 2)]
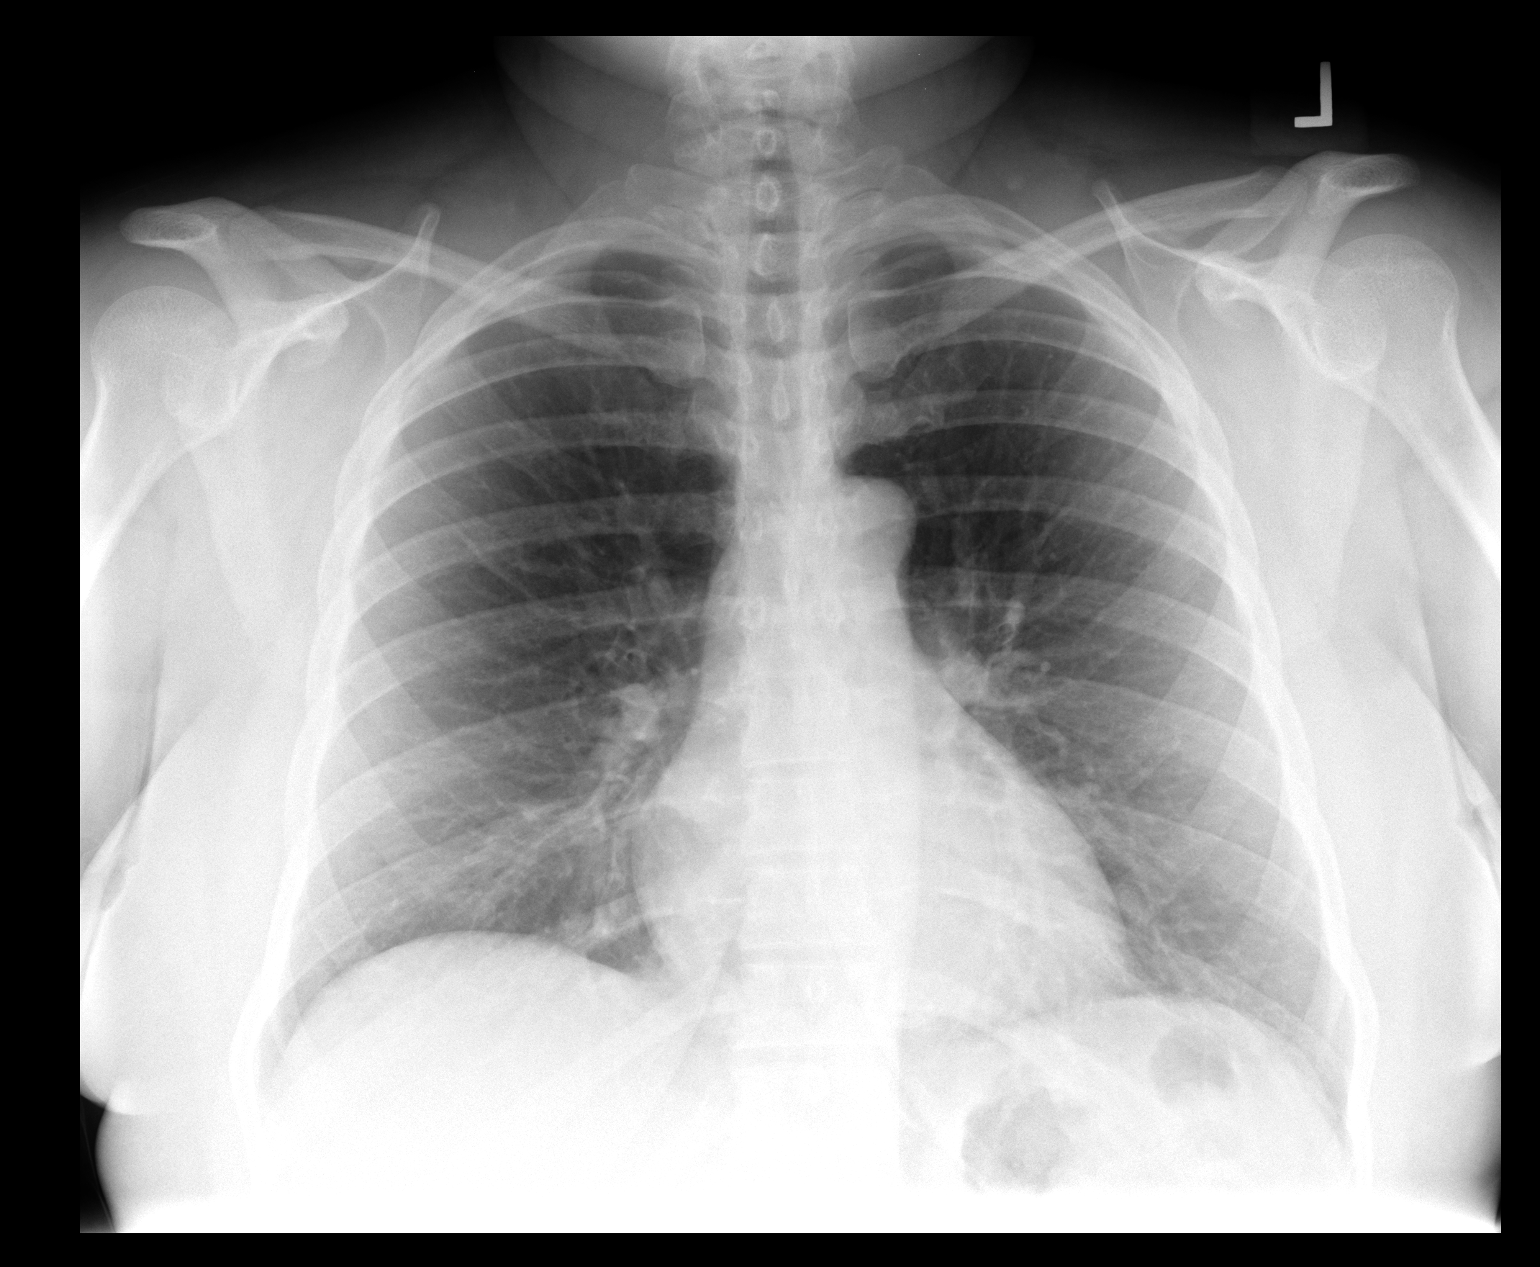

[view not recorded (2 of 2)]
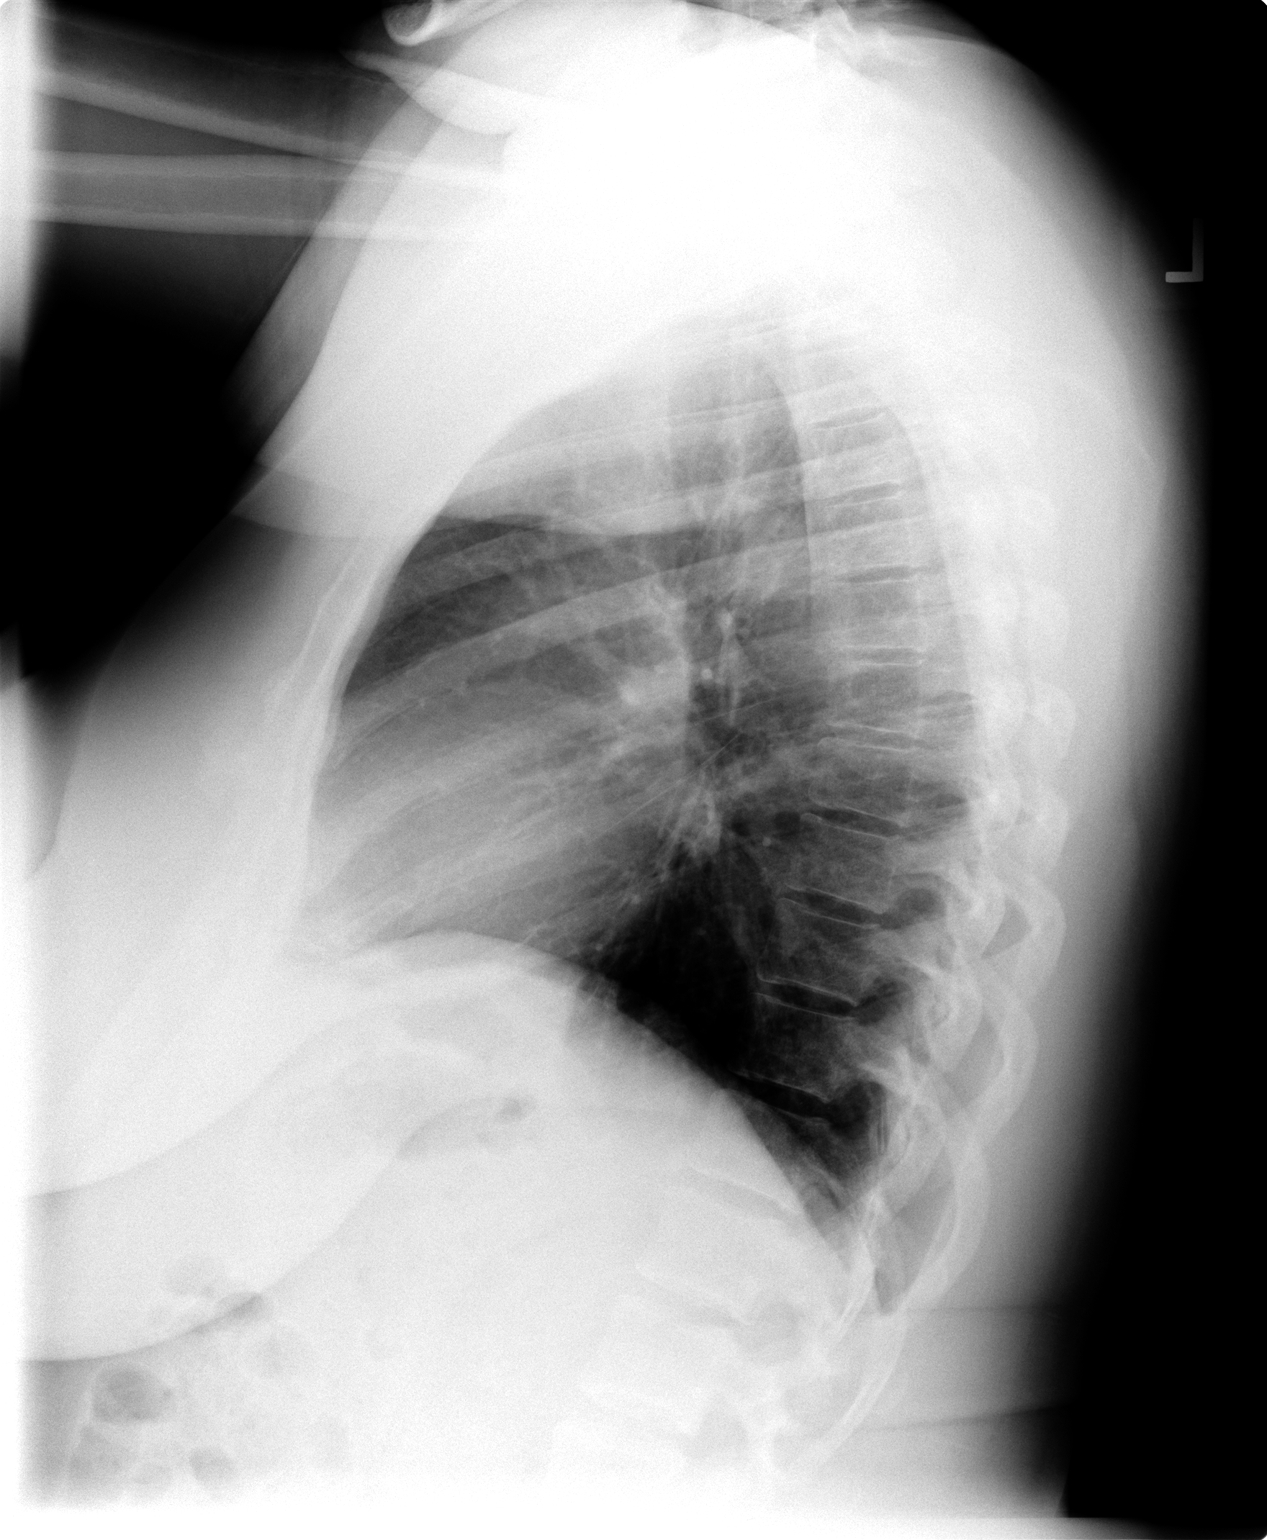

[2 of 2 positions shown; findings below may reference images not displayed]

FINDINGS: Upper-normal size of cardiac silhouette.
Mediastinal contours and pulmonary vascularity normal.
Peribronchial thickening.
No pulmonary infiltrate, pleural effusion or pneumothorax.
Bones unremarkable.
IMPRESSION: Mild bronchitic changes.

## 2012-07-07 MED ORDER — CEFTRIAXONE SODIUM 500 MG IJ SOLR
500.0000 mg | Freq: Once | INTRAMUSCULAR | Status: AC
  Administered 2012-07-07: 500 mg via INTRAMUSCULAR

## 2012-07-07 MED ORDER — BENZONATATE 100 MG PO CAPS: 100.0000 mg | ORAL_CAPSULE | Freq: Four times a day (QID) | ORAL | Status: DC | PRN

## 2012-07-07 MED ORDER — ALBUTEROL SULFATE (2.5 MG/3ML) 0.083% IN NEBU
2.5000 mg | INHALATION_SOLUTION | Freq: Once | RESPIRATORY_TRACT | Status: AC
  Administered 2012-07-07: 2.5 mg via RESPIRATORY_TRACT

## 2012-07-07 MED ORDER — TRIAMTERENE-HCTZ 37.5-25 MG PO TABS: 1.0000 | ORAL_TABLET | Freq: Every day | ORAL | Status: DC

## 2012-07-07 MED ORDER — PREDNISONE (PAK) 5 MG PO TABS: 5.0000 mg | ORAL_TABLET | ORAL | Status: DC

## 2012-07-07 MED ORDER — IPRATROPIUM BROMIDE 0.02 % IN SOLN
0.5000 mg | Freq: Once | RESPIRATORY_TRACT | Status: AC
  Administered 2012-07-07: 0.5 mg via RESPIRATORY_TRACT

## 2012-07-07 MED ORDER — PROMETHAZINE-DM 6.25-15 MG/5ML PO SYRP: ORAL_SOLUTION | ORAL | Status: AC

## 2012-07-07 MED ORDER — METHYLPREDNISOLONE ACETATE 80 MG/ML IJ SUSP
80.0000 mg | Freq: Once | INTRAMUSCULAR | Status: AC
  Administered 2012-07-07: 80 mg via INTRAMUSCULAR

## 2012-07-07 MED ORDER — SULFAMETHOXAZOLE-TRIMETHOPRIM 800-160 MG PO TABS: 1.0000 | ORAL_TABLET | Freq: Two times a day (BID) | ORAL | Status: AC

## 2012-07-07 NOTE — Assessment & Plan Note (Addendum)
Unchanged. Patient re-educated about  the importance of commitment to a  minimum of 150 minutes of exercise per week. The importance of healthy food choices with portion control discussed. Encouraged to start a food diary, count calories and to consider  joining a support group. Sample diet sheets offered. Goals set by the patient for the next several months.    

## 2012-07-07 NOTE — Patient Instructions (Addendum)
CPE in 6 weeks.  You have bronchitis and sinusitis. You will get a breathing treatment in the office, and medication is also sent to your pharmacy, please make sure you take the entire course of the antibiotic, septra. Injections of Rocephin and depo medrol are also administered in the office.  CXR today.  Labs today, cbc and diff, fasting lipid, cmmp, hBA1C, TSH andvit D  Please schedule your mammogram  Please get your flu vaccine in approx 2 weeks when you are better.  Medication is prescribed for your blood pressure, which is high. You need to lower salt intake, lose weight and eat a diet rich in vegetable and fruit.  It is important that you exercise regularly at least 30 minutes 5 times a week. If you develop chest pain, have severe difficulty breathing, or feel very tired, stop exercising immediately and seek medical attention

## 2012-07-07 NOTE — Assessment & Plan Note (Signed)
Uncontrolled. DASH diet and commitment to daily physical activity for a minimum of 30 minutes discussed and encouraged, as a part of hypertension management. The importance of attaining a healthy weight is also discussed.  Pt to start anti hypertensive med once more, changed to maxzide

## 2012-07-07 NOTE — Assessment & Plan Note (Signed)
acute infection, neb treatment in office, antibiotic in office, and meds prescribed as well

## 2012-07-07 NOTE — Assessment & Plan Note (Signed)
Antibiotic course prescribed 

## 2012-07-07 NOTE — Progress Notes (Signed)
  Subjective:    Patient ID: Amy Frazier, female    DOB: [birth year], 46 y.o.   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 Needs health maintainance done Had been trying to conceive and unfortunately has put this off, due to her age, has become reconciled with this after much emotional stress Has been non compliant with BP meds, and is behind in her health maintenance, intends to "catch up"    Review of Systems See HPI  Denies chest pains, palpitations and leg swelling Denies abdominal pain, nausea, vomiting,diarrhea or constipation.   Denies dysuria, frequency, hesitancy or incontinence. Denies joint pain, swelling and limitation in mobility. Denies headaches, seizures, numbness, or tingling. Denies depression, anxiety or insomnia. Denies skin break down or rash.        Objective:   Physical Exam Patient alert and oriented and in no cardiopulmonary distress.  HEENT: No facial asymmetry, EOMI, maxillary  sinus tenderness,  oropharynx pink and moist.  Neck supple no adenopathy.Erythema and edema of nasal mucosa  Chest:decreased air entry, scattered crackles and wheezes   CVS: S1, S2 no murmurs, no S3.  ABD: Soft non tender. Bowel sounds normal.  Ext: No edema  MS: Adequate ROM spine, shoulders, hips and knees.  Skin: Intact, no ulcerations or rash noted.  Psych: Good eye contact, normal affect. Memory intact not anxious or depressed appearing.  CNS: CN 2-12 intact, power, tone and sensation normal throughout.        Assessment & Plan:

## 2012-07-08 ENCOUNTER — Other Ambulatory Visit: Payer: Self-pay | Admitting: Family Medicine

## 2012-07-08 ENCOUNTER — Other Ambulatory Visit: Payer: Self-pay

## 2012-07-08 DIAGNOSIS — E559 Vitamin D deficiency, unspecified: Secondary | ICD-10-CM

## 2012-07-08 LAB — COMPREHENSIVE METABOLIC PANEL
ALT: 24 U/L (ref 0–35)
AST: 22 U/L (ref 0–37)
Albumin: 4.5 g/dL (ref 3.5–5.2)
CO2: 23 mEq/L (ref 19–32)
Calcium: 9.4 mg/dL (ref 8.4–10.5)
Chloride: 106 mEq/L (ref 96–112)
Potassium: 4 mEq/L (ref 3.5–5.3)
Sodium: 138 mEq/L (ref 135–145)
Total Protein: 7.6 g/dL (ref 6.0–8.3)

## 2012-07-08 LAB — LIPID PANEL
LDL Cholesterol: 119 mg/dL — ABNORMAL HIGH (ref 0–99)
VLDL: 48 mg/dL — ABNORMAL HIGH (ref 0–40)

## 2012-07-08 LAB — TSH: TSH: 1.243 u[IU]/mL (ref 0.350–4.500)

## 2012-07-08 MED ORDER — ERGOCALCIFEROL 1.25 MG (50000 UT) PO CAPS: 50000.0000 [IU] | ORAL_CAPSULE | ORAL | Status: DC

## 2012-07-16 ENCOUNTER — Telehealth: Payer: Self-pay

## 2012-07-16 ENCOUNTER — Other Ambulatory Visit: Payer: Self-pay | Admitting: Family Medicine

## 2012-07-16 ENCOUNTER — Other Ambulatory Visit: Payer: Self-pay

## 2012-07-16 DIAGNOSIS — J209 Acute bronchitis, unspecified: Secondary | ICD-10-CM

## 2012-07-16 MED ORDER — OSELTAMIVIR PHOSPHATE 75 MG PO CAPS: 75.0000 mg | ORAL_CAPSULE | Freq: Two times a day (BID) | ORAL | Status: DC

## 2012-07-16 MED ORDER — PROMETHAZINE HCL 12.5 MG PO TABS: 12.5000 mg | ORAL_TABLET | Freq: Three times a day (TID) | ORAL | Status: DC | PRN

## 2012-07-16 NOTE — Telephone Encounter (Signed)
Symptoms sound like influenza, advise tylenol and fluids and rest, I am entering tamiflu and phenergan, send adfter you spk with her please Let her know labs last week did not suggest severe bacterial infection, rather a viral infection so I believe this will make a difference.

## 2012-07-16 NOTE — Telephone Encounter (Signed)
Patient aware.

## 2012-08-21 ENCOUNTER — Other Ambulatory Visit (HOSPITAL_COMMUNITY)
Admission: RE | Admit: 2012-08-21 | Discharge: 2012-08-21 | Disposition: A | Discharge status: Home / Self Care | Payer: BC Managed Care – PPO | Source: Ambulatory Visit | Attending: Family Medicine | Admitting: Family Medicine

## 2012-08-21 ENCOUNTER — Ambulatory Visit (INDEPENDENT_AMBULATORY_CARE_PROVIDER_SITE_OTHER): Payer: BC Managed Care – PPO | Admitting: Family Medicine

## 2012-08-21 ENCOUNTER — Encounter: Payer: Self-pay | Admitting: Family Medicine

## 2012-08-21 VITALS — BP 122/84 | HR 80 | Resp 16 | Ht 65.0 in | Wt 206.1 lb

## 2012-08-21 DIAGNOSIS — E559 Vitamin D deficiency, unspecified: Secondary | ICD-10-CM

## 2012-08-21 DIAGNOSIS — B369 Superficial mycosis, unspecified: Secondary | ICD-10-CM | POA: Insufficient documentation

## 2012-08-21 DIAGNOSIS — E669 Obesity, unspecified: Secondary | ICD-10-CM

## 2012-08-21 DIAGNOSIS — Z13220 Encounter for screening for lipoid disorders: Secondary | ICD-10-CM

## 2012-08-21 DIAGNOSIS — Z0000 Encounter for general adult medical examination without abnormal findings: Secondary | ICD-10-CM

## 2012-08-21 DIAGNOSIS — Z1211 Encounter for screening for malignant neoplasm of colon: Secondary | ICD-10-CM

## 2012-08-21 DIAGNOSIS — Z1151 Encounter for screening for human papillomavirus (HPV): Secondary | ICD-10-CM | POA: Insufficient documentation

## 2012-08-21 DIAGNOSIS — Z01419 Encounter for gynecological examination (general) (routine) without abnormal findings: Secondary | ICD-10-CM | POA: Insufficient documentation

## 2012-08-21 DIAGNOSIS — Z124 Encounter for screening for malignant neoplasm of cervix: Secondary | ICD-10-CM

## 2012-08-21 DIAGNOSIS — I10 Essential (primary) hypertension: Secondary | ICD-10-CM

## 2012-08-21 LAB — POC HEMOCCULT BLD/STL (OFFICE/1-CARD/DIAGNOSTIC): Fecal Occult Blood, POC: NEGATIVE

## 2012-08-21 MED ORDER — CLOTRIMAZOLE-BETAMETHASONE 1-0.05 % EX CREA: TOPICAL_CREAM | CUTANEOUS | Status: DC

## 2012-08-21 NOTE — Patient Instructions (Addendum)
F/u in 4.5 month, please call if you need me before. You have an upcoming appointment with Dr Jeanice Lim for skin tag removal  Congrats on dietary change with weight loss, keep it up!  Please commit to daily physical activity for a minimum of 30 minutes each day.   Blood pressure is excellent.  Fasting lipid, cmp , HBA1C and Vit D in 4.5 month  Please schedule your mammogram  Please get the flu vaccine, you need this  You will get a 1500 calorie diet sheeet , to help provide guidelines for eating

## 2012-08-22 NOTE — Progress Notes (Signed)
  Subjective:    Patient ID: Amy Frazier, female    DOB: 10/18/1965, 47 y.o.   MRN: [id number]  HPI The PT is here for annual exam and re-evaluation of chronic medical conditions, medication management and review of any available recent lab and radiology data.  Preventive health is updated, specifically  Cancer screening and Immunization.  Need mammogram and flu vaccine  The PT denies any adverse reactions to current medications since the last visit.  There are no new concerns.  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 Denies sinus pressure, nasal congestion, ear pain or sore throat. Denies chest congestion, productive cough or wheezing. Denies chest pains, palpitations and leg swelling Denies abdominal pain, nausea, vomiting,diarrhea or constipation.   Denies dysuria, frequency, hesitancy or incontinence. Denies joint pain, swelling and limitation in mobility. Denies headaches, seizures, numbness, or tingling. Denies depression, or insomnia. C/o pruritic skin rash in groin      Objective:   Physical Exam Pleasant well nourished female, alert and oriented x 3, in no cardio-pulmonary distress. Afebrile. HEENT No facial trauma or asymetry. Sinuses non tender.  EOMI, PERTL, fundoscopic exam is normal, no hemorhage or exudate.  External ears normal, tympanic membranes clear. Oropharynx moist, no exudate, good dentition. Neck: supple, no adenopathy,JVD or thyromegaly.No bruits.  Chest: Clear to ascultation bilaterally.No crackles or wheezes. Non tender to palpation  Breast: No asymetry,no masses. No nipple discharge or inversion. No axillary or supraclavicular adenopathy  Cardiovascular system; Heart sounds normal,  S1 and  S2 ,no S3.  No murmur, or  thrill. Apical beat not displaced Peripheral pulses normal.  Abdomen: Soft, non tender, no organomegaly or masses. No bruits. Bowel sounds normal. No guarding, tenderness or rebound.  Rectal:  No mass. Guaiac negative stool.  GU: External genitalia normal. No lesions. Vaginal canal normal.No discharge. Uterus normal size, no adnexal masses, no cervical motion or adnexal tenderness.  Musculoskeletal exam: Full ROM of spine, hips , shoulders and knees. No deformity ,swelling or crepitus noted. No muscle wasting or atrophy.   Neurologic: Cranial nerves 2 to 12 intact. Power, tone ,sensation and reflexes normal throughout. No disturbance in gait. No tremor.  Skin: Intact, fungal rash in groin Pigmentation normal throughout  Psych; Normal mood and affect. Judgement and concentration normal        Assessment & Plan:

## 2012-08-22 NOTE — Assessment & Plan Note (Signed)
Annual exam completed as dcumented, and pap sent. Pt congratulated on change in diet, she is to start daily exercise, and will f/u in 5 month, with updated ;labs, need to et flu vaccine also

## 2012-08-22 NOTE — Assessment & Plan Note (Signed)
Controlled, no change in medication DASH diet and commitment to daily physical activity for a minimum of 30 minutes discussed and encouraged, as a part of hypertension management. The importance of attaining a healthy weight is also discussed.  

## 2012-08-22 NOTE — Assessment & Plan Note (Signed)
Tinea cruris, med prescribed, and counseled re need to keep area dry

## 2012-08-22 NOTE — Assessment & Plan Note (Signed)
Improved. Pt applauded on succesful weight loss through lifestyle change, and encouraged to continue same. Weight loss goal set for the next several months.  

## 2012-10-04 ENCOUNTER — Other Ambulatory Visit: Payer: Self-pay | Admitting: Family Medicine

## 2012-10-19 ENCOUNTER — Other Ambulatory Visit: Payer: Self-pay | Admitting: Family Medicine

## 2012-10-19 DIAGNOSIS — Z139 Encounter for screening, unspecified: Secondary | ICD-10-CM

## 2012-10-22 ENCOUNTER — Ambulatory Visit (HOSPITAL_COMMUNITY)
Admission: RE | Admit: 2012-10-22 | Discharge: 2012-10-22 | Disposition: A | Discharge status: Home / Self Care | Payer: BC Managed Care – PPO | Source: Ambulatory Visit | Attending: Family Medicine | Admitting: Family Medicine

## 2012-10-22 DIAGNOSIS — Z1231 Encounter for screening mammogram for malignant neoplasm of breast: Secondary | ICD-10-CM | POA: Insufficient documentation

## 2012-10-22 DIAGNOSIS — Z139 Encounter for screening, unspecified: Secondary | ICD-10-CM

## 2013-01-04 ENCOUNTER — Ambulatory Visit: Payer: BC Managed Care – PPO | Admitting: Family Medicine

## 2013-02-15 ENCOUNTER — Other Ambulatory Visit: Payer: Self-pay | Admitting: Family Medicine

## 2013-09-14 ENCOUNTER — Other Ambulatory Visit: Payer: Self-pay | Admitting: Family Medicine

## 2013-12-16 ENCOUNTER — Emergency Department (HOSPITAL_COMMUNITY)
Admission: EM | Admit: 2013-12-16 | Discharge: 2013-12-16 | Disposition: A | Discharge status: Home / Self Care | Payer: Worker's Compensation | Attending: Emergency Medicine | Admitting: Emergency Medicine

## 2013-12-16 ENCOUNTER — Encounter (HOSPITAL_COMMUNITY): Payer: Self-pay | Admitting: Emergency Medicine

## 2013-12-16 DIAGNOSIS — E669 Obesity, unspecified: Secondary | ICD-10-CM | POA: Diagnosis not present

## 2013-12-16 DIAGNOSIS — S61209A Unspecified open wound of unspecified finger without damage to nail, initial encounter: Secondary | ICD-10-CM | POA: Diagnosis not present

## 2013-12-16 DIAGNOSIS — Y9289 Other specified places as the place of occurrence of the external cause: Secondary | ICD-10-CM | POA: Diagnosis not present

## 2013-12-16 DIAGNOSIS — I10 Essential (primary) hypertension: Secondary | ICD-10-CM | POA: Diagnosis not present

## 2013-12-16 DIAGNOSIS — W260XXA Contact with knife, initial encounter: Secondary | ICD-10-CM | POA: Diagnosis not present

## 2013-12-16 DIAGNOSIS — S6990XA Unspecified injury of unspecified wrist, hand and finger(s), initial encounter: Secondary | ICD-10-CM | POA: Diagnosis present

## 2013-12-16 DIAGNOSIS — Z79899 Other long term (current) drug therapy: Secondary | ICD-10-CM | POA: Insufficient documentation

## 2013-12-16 DIAGNOSIS — F411 Generalized anxiety disorder: Secondary | ICD-10-CM | POA: Diagnosis not present

## 2013-12-16 DIAGNOSIS — Z7982 Long term (current) use of aspirin: Secondary | ICD-10-CM | POA: Insufficient documentation

## 2013-12-16 DIAGNOSIS — Y9389 Activity, other specified: Secondary | ICD-10-CM | POA: Diagnosis not present

## 2013-12-16 DIAGNOSIS — S61219A Laceration without foreign body of unspecified finger without damage to nail, initial encounter: Secondary | ICD-10-CM

## 2013-12-16 DIAGNOSIS — W261XXA Contact with sword or dagger, initial encounter: Secondary | ICD-10-CM

## 2013-12-16 MED ORDER — LIDOCAINE HCL (PF) 1 % IJ SOLN
5.0000 mL | Freq: Once | INTRAMUSCULAR | Status: AC
  Administered 2013-12-16: 12:00:00

## 2013-12-16 MED ORDER — LIDOCAINE HCL (PF) 1 % IJ SOLN
INTRAMUSCULAR | Status: AC
  Filled 2013-12-16: qty 5

## 2013-12-16 MED ORDER — HYDROCODONE-ACETAMINOPHEN 5-325 MG PO TABS: 1.0000 | ORAL_TABLET | ORAL | Status: DC | PRN

## 2013-12-16 NOTE — ED Notes (Signed)
Pt with lac to left thumb from cutting with knife today while at work, states she works at [employer], bleeding controlled at this time, pt states last tetanus has been within last 5 years

## 2013-12-16 NOTE — ED Notes (Signed)
Pt verbalized understanding of no driving within 4 hours of taking vicodin due to med causes drowsiness; pt sent to lab for urine drug screen required by employer

## 2013-12-16 NOTE — ED Notes (Signed)
Pt states she cut her left thumb on a knife at work

## 2013-12-16 NOTE — ED Provider Notes (Signed)
CSN: [id number]     Arrival date & time 12/16/13  [id number] History   First MD Initiated Contact with Patient 12/16/13 1032     Chief Complaint  Patient presents with  . Extremity Laceration     (Consider location/radiation/quality/duration/timing/severity/associated sxs/prior Treatment) Patient is a 48 y.o. female presenting with hand injury. The history is provided by the patient.  Hand Injury Location:  Hand Time since incident:  2 hours Injury: yes   Mechanism of injury comment:  Laceration Associated symptoms: no back pain and no neck pain     Past Medical History  Diagnosis Date  . Hypertension   . Anxiety   . Obesity   . PCO (polycystic ovaries) 1999   Past Surgical History  Procedure Laterality Date  . Ectopic pregnancy surgery     Family History  Problem Relation Age of Onset  . COPD Mother   . Diabetes Mother   . Hypertension Father    History  Substance Use Topics  . Smoking status: Never Smoker   . Smokeless tobacco: Not on file  . Alcohol Use: No   OB History   Grav Para Term Preterm Abortions TAB SAB Ect Mult Living                 Review of Systems  Constitutional: Negative for activity change.       All ROS Neg except as noted in HPI  HENT: Negative for nosebleeds.   Eyes: Negative for photophobia and discharge.  Respiratory: Negative for cough, shortness of breath and wheezing.   Cardiovascular: Negative for chest pain and palpitations.  Gastrointestinal: Negative for abdominal pain and blood in stool.  Genitourinary: Negative for dysuria, frequency and hematuria.  Musculoskeletal: Negative for arthralgias, back pain and neck pain.  Skin: Negative.   Neurological: Negative for dizziness, seizures and speech difficulty.  Psychiatric/Behavioral: Negative for hallucinations and confusion. The patient is nervous/anxious.       Allergies  Review of patient's allergies indicates no known allergies.  Home Medications   Prior to Admission  medications   Medication Sig Start Date End Date Taking? Authorizing Provider  aspirin 325 MG tablet Take 325 mg by mouth every 6 (six) hours as needed for headache.   Yes Historical Provider, MD  triamterene-hydrochlorothiazide [facility](MAXZIDE-25) 37.5-25 MG per tablet take 1 tablet by mouth once daily 10/19/12  Yes Kerri PerchesMargaret E Simpson, MD  Vitamin D, Ergocalciferol, (DRISDOL) 50000 UNITS CAPS take 1 capsule every week 02/15/13  Yes Kerri PerchesMargaret E Simpson, MD   BP 142/80  Pulse 72  Temp(Src) 98.1 F (36.7 C) (Oral)  Resp 16  Ht 5\' 5"  (1.651 m)  Wt 215 lb (97.523 kg)  BMI 35.78 kg/m2  SpO2 100%  LMP 11/12/2013 Physical Exam  Nursing note and vitals reviewed. Constitutional: She is oriented to person, place, and time. She appears well-developed and well-nourished.  Non-toxic appearance.  HENT:  Head: Normocephalic.  Right Ear: Tympanic membrane and external ear normal.  Left Ear: Tympanic membrane and external ear normal.  Eyes: EOM and lids are normal. Pupils are equal, round, and reactive to light.  Neck: Normal range of motion. Neck supple. Carotid bruit is not present.  Cardiovascular: Normal rate, regular rhythm, normal heart sounds, intact distal pulses and normal pulses.   Pulmonary/Chest: Breath sounds normal. No respiratory distress.  Abdominal: Soft. Bowel sounds are normal. There is no tenderness. There is no guarding.  Musculoskeletal: Normal range of motion.       Left hand: She exhibits  tenderness. She exhibits normal range of motion and normal capillary refill. Normal sensation noted. Normal strength noted.       Hands: Lymphadenopathy:       Head (right side): No submandibular adenopathy present.       Head (left side): No submandibular adenopathy present.    She has no cervical adenopathy.  Neurological: She is alert and oriented to person, place, and time. She has normal strength. No cranial nerve deficit or sensory deficit.  Skin: Skin is warm and dry.  Psychiatric: Her  speech is normal. Her mood appears anxious.    ED Course  LACERATION REPAIR Date/Time: 12/16/2013 12:56 PM Performed by: Kathie DikeBRYANT, Ivyrose Hashman M Authorized by: Kathie DikeBRYANT, Blakelyn Dinges M Consent: Verbal consent obtained. Risks and benefits: risks, benefits and alternatives were discussed Consent given by: patient Patient understanding: patient states understanding of the procedure being performed Patient identity confirmed: arm band Time out: Immediately prior to procedure a "time out" was called to verify the correct patient, procedure, equipment, support staff and site/side marked as required. Body area: upper extremity Location details: left thumb Laceration length: 2.3 cm Foreign bodies: no foreign bodies Tendon involvement: none Nerve involvement: none Vascular damage: no Anesthesia: local infiltration Local anesthetic: lidocaine 1% without epinephrine Patient sedated: no Preparation: Patient was prepped and draped in the usual sterile fashion. Irrigation solution: saline Amount of cleaning: standard Skin closure: 4-0 nylon Number of sutures: 4 Technique: simple Approximation: close Approximation difficulty: simple Dressing: gauze roll Patient tolerance: Patient tolerated the procedure well with no immediate complications.   (including critical care time) Labs Review Labs Reviewed - No data to display  Imaging Review No results found.   EKG Interpretation None      MDM patient sustained a laceration to the left thumb while at work. The wound was repaired with 4 sutures of 4-0 nylon. The patient is advised to keep the wound clean and dry to prevent infection. Patient advised to have stitches removed in 7 days. Patient is to present to the emergency department sooner if any changes or problems. Tetanus status is up-to-date.    Final diagnoses:  None    **I have reviewed nursing notes, vital signs, and all appropriate lab and imaging results for this patient.Kathie Dike*    Kyran Connaughton M  Nomi Rudnicki, PA-C 12/20/13 1932  Kathie DikeHobson M Melva Faux, PA-C 12/20/13 [phone]

## 2013-12-16 NOTE — Discharge Instructions (Signed)
Your wound was repaired with 4 sutures. Please keep the wound clean and dry. Please use a glove and rubber band if you aren't in the shower or in a wet environment. Please have the sutures removed in 7 days. Please see your primary physician or return to the emergency department if any red streaks up the arm, high fever, or signs of advancing infection. Stitches, Staples, or Skin Adhesive Strips  Stitches (sutures), staples, and skin adhesive strips hold the skin together as it heals. They will usually be in place for 7 days or less. HOME CARE  Wash your hands with soap and water before and after you touch your wound.  Only take medicine as told by your doctor.  Cover your wound only if your doctor told you to. Otherwise, leave it open to air.  Do not get your stitches wet or dirty. If they get dirty, dab them gently with a clean washcloth. Wet the washcloth with soapy water. Do not rub. Pat them dry gently.  Do not put medicine or medicated cream on your stitches unless your doctor told you to.  Do not take out your own stitches or staples. Skin adhesive strips will fall off by themselves.  Do not pick at the wound. Picking can cause an infection.  Do not miss your follow-up appointment.  If you have problems or questions, call your doctor. GET HELP RIGHT AWAY IF:   You have a temperature by mouth above 102 F (38.9 C), not controlled by medicine.  You have chills.  You have redness or pain around your stitches.  There is puffiness (swelling) around your stitches.  You notice fluid (drainage) from your stitches.  There is a bad smell coming from your wound. MAKE SURE YOU:  Understand these instructions.  Will watch your condition.  Will get help if you are not doing well or get worse. Document Released: 05/19/2009 Document Revised: 10/14/2011 Document Reviewed: 05/19/2009 [facility] Patient Information 2014 [employer] [place].

## 2013-12-21 NOTE — ED Provider Notes (Signed)
Medical screening examination/treatment/procedure(s) were performed by non-physician practitioner and as supervising physician I was immediately available for consultation/collaboration.     Tonimarie Gritz, MD 12/21/13 0701 

## 2013-12-23 ENCOUNTER — Emergency Department (HOSPITAL_COMMUNITY)
Admission: EM | Admit: 2013-12-23 | Discharge: 2013-12-23 | Disposition: A | Discharge status: Home / Self Care | Payer: Worker's Compensation | Attending: Emergency Medicine | Admitting: Emergency Medicine

## 2013-12-23 ENCOUNTER — Encounter (HOSPITAL_COMMUNITY): Payer: Self-pay | Admitting: Emergency Medicine

## 2013-12-23 DIAGNOSIS — Z8659 Personal history of other mental and behavioral disorders: Secondary | ICD-10-CM | POA: Diagnosis not present

## 2013-12-23 DIAGNOSIS — Z4802 Encounter for removal of sutures: Secondary | ICD-10-CM | POA: Diagnosis not present

## 2013-12-23 DIAGNOSIS — E669 Obesity, unspecified: Secondary | ICD-10-CM | POA: Insufficient documentation

## 2013-12-23 DIAGNOSIS — Z79899 Other long term (current) drug therapy: Secondary | ICD-10-CM | POA: Diagnosis not present

## 2013-12-23 DIAGNOSIS — I10 Essential (primary) hypertension: Secondary | ICD-10-CM | POA: Insufficient documentation

## 2013-12-23 DIAGNOSIS — Z7982 Long term (current) use of aspirin: Secondary | ICD-10-CM | POA: Insufficient documentation

## 2013-12-23 NOTE — Discharge Instructions (Signed)
Suture Removal, Care After Refer to this sheet in the next few weeks. These instructions provide you with information on caring for yourself after your procedure. Your health care provider may also give you more specific instructions. Your treatment has been planned according to current medical practices, but problems sometimes occur. Call your health care provider if you have any problems or questions after your procedure. WHAT TO EXPECT AFTER THE PROCEDURE After your stitches (sutures) are removed, it is typical to have the following:  Some discomfort and swelling in the wound area.  Slight redness in the area. HOME CARE INSTRUCTIONS   If you have skin adhesive strips over the wound area, do not take the strips off. They will fall off on their own in a few days. If the strips remain in place after 14 days, you may remove them.  Change any bandages (dressings) at least once a day or as directed by your health care provider. If the bandage sticks, soak it off with warm, soapy water.  Apply cream or ointment only as directed by your health care provider. If using cream or ointment, wash the area with soap and water 2 times a day to remove all the cream or ointment. Rinse off the soap and pat the area dry with a clean towel.  Keep the wound area dry and clean. If the bandage becomes wet or dirty, or if it develops a bad smell, change it as soon as possible.  Continue to protect the wound from injury.  Use sunscreen when out in the sun. New scars become sunburned easily. SEEK MEDICAL CARE IF:  You have increasing redness, swelling, or pain in the wound.  You see pus coming from the wound.  You have a fever.  You notice a bad smell coming from the wound or dressing.  Your wound breaks open (edges not staying together). Document Released: 04/16/2001 Document Revised: 05/12/2013 Document Reviewed: 03/03/2013 [employer] Patient Information 2014 [employer].  

## 2013-12-23 NOTE — ED Provider Notes (Signed)
  Medical screening examination/treatment/procedure(s) were performed by non-physician practitioner and as supervising physician I was immediately available for consultation/collaboration.   EKG Interpretation None         Jamey Harman, MD 12/23/13 1545 

## 2013-12-23 NOTE — ED Notes (Signed)
Here for suture removal from left thumb.

## 2013-12-23 NOTE — ED Provider Notes (Signed)
CSN: [id number]     Arrival date & time 12/23/13  1001 History   First MD Initiated Contact with Patient 12/23/13 1013     Chief Complaint  Patient presents with  . Suture / Staple Removal     (Consider location/radiation/quality/duration/timing/severity/associated sxs/prior Treatment) HPI Comments: Amy Frazier is a 48 y.o. Female presenting for suture removal of 4 stitches placed in her left thumb 7 days ago when she sliced it while using a knife.  She has no  Complaints at this time and the wound appears well healed.  No fevers, no redness, swelling or drainage.      The history is provided by the patient.    Past Medical History  Diagnosis Date  . Hypertension   . Anxiety   . Obesity   . PCO (polycystic ovaries) 1999   Past Surgical History  Procedure Laterality Date  . Ectopic pregnancy surgery     Family History  Problem Relation Age of Onset  . COPD Mother   . Diabetes Mother   . Hypertension Father    History  Substance Use Topics  . Smoking status: Never Smoker   . Smokeless tobacco: Not on file  . Alcohol Use: No   OB History   Grav Para Term Preterm Abortions TAB SAB Ect Mult Living                 Review of Systems  Constitutional: Negative for fever and chills.  Respiratory: Negative for shortness of breath and wheezing.   Skin: Positive for wound.  Neurological: Negative for numbness.      Allergies  Review of patient's allergies indicates no known allergies.  Home Medications   Prior to Admission medications   Medication Sig Start Date End Date Taking? Authorizing Provider  aspirin 325 MG tablet Take 325 mg by mouth every 6 (six) hours as needed for headache.    Historical Provider, MD  HYDROcodone-acetaminophen (NORCO/VICODIN) 5-325 MG per tablet Take 1 tablet by mouth every 4 (four) hours as needed for moderate pain. 12/16/13   Kathie DikeHobson M Bryant, PA-C  triamterene-hydrochlorothiazide [facility](MAXZIDE-25) 37.5-25 MG per tablet take 1 tablet by  mouth once daily 10/19/12   Kerri PerchesMargaret E Simpson, MD  Vitamin D, Ergocalciferol, (DRISDOL) 50000 UNITS CAPS take 1 capsule every week 02/15/13   Kerri PerchesMargaret E Simpson, MD   BP 137/76  Pulse 75  Temp(Src) 98.4 F (36.9 C) (Oral)  Resp 20  Ht 5\' 5"  (1.651 m)  Wt 215 lb (97.523 kg)  BMI 35.78 kg/m2  SpO2 100%  LMP 12/18/2013 Physical Exam  Constitutional: She is oriented to person, place, and time. She appears well-developed and well-nourished.  HENT:  Head: Normocephalic.  Cardiovascular: Normal rate.   Pulmonary/Chest: Effort normal.  Neurological: She is alert and oriented to person, place, and time. No sensory deficit.  Skin: Laceration noted.  Well healed laceration left proximal thumb.    ED Course  Procedures (including critical care time)  SUTURE REMOVAL Performed by: Burgess AmorJulie Garry Nicolini  Consent: Verbal consent obtained. Patient identity confirmed: provided demographic data Time out: Immediately prior to procedure a "time out" was called to verify the correct patient, procedure, equipment, support staff and site/side marked as required.  Location details: left thumb  Wound Appearance: clean  Sutures/Staples Removed: #4 sutures.  Placed sterile strips for a few days of extra support.  Facility: sutures placed in this facility Patient tolerance: Patient tolerated the procedure well with no immediate complications.    Labs Review Labs Reviewed -  No data to display  Imaging Review No results found.   EKG Interpretation None      MDM   Final diagnoses:  Visit for suture removal    Prn f/u    Burgess AmorJulie Orpha Dain, PA-C 12/23/13 1047

## 2018-09-10 ENCOUNTER — Ambulatory Visit (INDEPENDENT_AMBULATORY_CARE_PROVIDER_SITE_OTHER): Payer: BLUE CROSS/BLUE SHIELD | Admitting: Family Medicine

## 2018-09-10 ENCOUNTER — Encounter: Payer: Self-pay | Admitting: Family Medicine

## 2018-09-10 VITALS — BP 138/90 | HR 76 | Resp 12 | Ht 64.0 in | Wt 218.0 lb

## 2018-09-10 DIAGNOSIS — E669 Obesity, unspecified: Secondary | ICD-10-CM | POA: Diagnosis not present

## 2018-09-10 DIAGNOSIS — Z0000 Encounter for general adult medical examination without abnormal findings: Secondary | ICD-10-CM

## 2018-09-10 DIAGNOSIS — I10 Essential (primary) hypertension: Secondary | ICD-10-CM

## 2018-09-10 DIAGNOSIS — H538 Other visual disturbances: Secondary | ICD-10-CM

## 2018-09-10 MED ORDER — TRIAMTERENE-HCTZ 37.5-25 MG PO TABS: 1.0000 | ORAL_TABLET | Freq: Every day | ORAL | 4 refills | Status: DC

## 2018-09-10 NOTE — Progress Notes (Signed)
New Patient Office Visit  Subjective:  Patient ID: Amy Frazier, female    DOB: 27-Oct-1965  Age: 53 y.o. MRN: [id number]  CC:  Chief Complaint  Patient presents with  . new patient    HPI  Amy Frazier is a pleasant 53 year old female who presents to the office today for a new patient appt. She is known to the clinic, but loss insurance a few years back. She now has a new job at [employer] and has insurance. She has been receiving care at the health department. They have maintained her BP medication, thought she reports she rather not take it anymore. She also report not taking it consistently, so that she would not run out until being seen today. She has no complaints or concerns outside of wanting to stop the BP medication. She is not having trouble with the medication, she just rather not be on it. She has a previous medical history of BP, obesity, anxiety, PCOS,and ectopic pregnancy (sx). She also has not had a mammo, colonoscopy, labs or eye or dental checks in several years she reports.  Socially she reports nothing has change. Still lives with husband Gabriel Rung of 22 years. No children. Visit with family regularly. Would like to attend a church, but she watches e-church for now. She did file for bankruptcy last year, this was extremely stressful for her, but she is starting to feel better now that she is working again.   Past Medical History:  Diagnosis Date  . Anxiety   . Hypertension   . Obesity   . PCO (polycystic ovaries) 1999    Past Surgical History:  Procedure Laterality Date  . ECTOPIC PREGNANCY SURGERY      Family History  Problem Relation Age of Onset  . Diabetes Mother   . Obesity Mother   . Heart disease Mother   . Hypertension Father     Social History   Socioeconomic History  . Marital status: Married    Spouse name: Gabriel Rung -22 years   . Number of children: 0  . Years of education: 98  . Highest education level: 12th grade  Occupational History  .  Occupation: Insurance underwriter  . Financial resource strain: Somewhat hard  . Food insecurity:    Worry: Sometimes true    Inability: Sometimes true  . Transportation needs:    Medical: No    Non-medical: No  Tobacco Use  . Smoking status: Never Smoker  . Smokeless tobacco: Never Used  Substance and Sexual Activity  . Alcohol use: No  . Drug use: No  . Sexual activity: Yes    Partners: Male    Birth control/protection: None  Lifestyle  . Physical activity:    Days per week: 0 days    Minutes per session: 0 min  . Stress: Only a little  Relationships  . Social connections:    Talks on phone: More than three times a week    Gets together: More than three times a week    Attends religious service: 1 to 4 times per year    Active member of club or organization: No    Attends meetings of clubs or organizations: Never    Relationship status: Married  . Intimate partner violence:    Fear of current or ex partner: No    Emotionally abused: No    Physically abused: No    Forced sexual activity: No  Other Topics Concern  . Not on file  Social History Narrative   Lives alone with husband married for 22 years     ROS Review of Systems  Constitutional: Negative for activity change, appetite change and fatigue.  HENT: Negative for congestion, sinus pain and sore throat.   Eyes: Positive for visual disturbance. Negative for pain and itching.       Wears readers: needs to get eyes check.  Reports blurry vision  Respiratory: Negative for cough and shortness of breath.   Cardiovascular: Negative for chest pain and palpitations.  Gastrointestinal: Negative.  Negative for constipation, diarrhea, nausea and vomiting.  Endocrine: Negative for cold intolerance, heat intolerance, polydipsia, polyphagia and polyuria.  Genitourinary: Negative for difficulty urinating, dyspareunia, dysuria, menstrual problem, pelvic pain, vaginal bleeding, vaginal discharge and vaginal pain.    Musculoskeletal: Negative for arthralgias and back pain.  Skin: Negative for rash and wound.  Allergic/Immunologic: Positive for environmental allergies. Negative for food allergies.       Seasonal- not on anything for this  Neurological: Negative for dizziness, tremors, weakness and headaches.  Hematological: Negative for adenopathy. Does not bruise/bleed easily.  Psychiatric/Behavioral: Negative for confusion and sleep disturbance. The patient is nervous/anxious.        Occasional/situational anxiety and depression not on tx for either.  All other systems reviewed and are negative.   Objective:   Today's Vitals: BP (!) 134/91   Pulse 67   Resp 12   Ht 5\' 4"  (1.626 m)   Wt 218 lb (98.9 kg)   LMP 09/02/2018 (Exact Date)   SpO2 100% Comment: room air  BMI 37.42 kg/m   Physical Exam Vitals signs and nursing note reviewed.  Constitutional:      General: She is not in acute distress.    Appearance: Normal appearance. She is obese. She is not ill-appearing.  HENT:     Head: Normocephalic and atraumatic.     Right Ear: Tympanic membrane normal.     Left Ear: Tympanic membrane normal.     Nose: Nose normal.     Mouth/Throat:     Mouth: Mucous membranes are moist.     Pharynx: Oropharynx is clear.  Eyes:     Pupils: Pupils are equal, round, and reactive to light.  Neck:     Musculoskeletal: Normal range of motion and neck supple.  Cardiovascular:     Rate and Rhythm: Normal rate and regular rhythm.     Pulses: Normal pulses.     Heart sounds: Normal heart sounds.  Pulmonary:     Effort: Pulmonary effort is normal.     Breath sounds: Normal breath sounds.  Abdominal:     General: Abdomen is flat.     Palpations: Abdomen is soft.  Musculoskeletal: Normal range of motion.  Skin:    General: Skin is warm and dry.     Capillary Refill: Capillary refill takes less than 2 seconds.  Neurological:     General: No focal deficit present.     Mental Status: She is alert and  oriented to person, place, and time.  Psychiatric:        Mood and Affect: Mood normal.        Behavior: Behavior normal. Behavior is cooperative.        Thought Content: Thought content normal.        Judgment: Judgment normal.     Comments: Flat affect, but interactive.      Assessment & Plan:   1. Obesity (BMI 35.0-39.9 without comorbidity) Today in clinic, BMI is  37.42, she has gained 12 pounds in the last 5 years, since being last seen here. She reports not eating as well or exercising. She would like to get better with these. Educated on DASH diet and lifestyle changes, provided with exercise prescription. Will check labs as it has been several years. Will see her back in a month for annual/CPE/healthmaintenance.  - Lipid Profile - HgB A1c - CBC - COMPLETE METABOLIC PANEL WITH GFR - TSH - Vitamin D (25 hydroxy)  2. Essential hypertension Today in office she reports not taking her medication has directed. Also reports she does not want to be on it. BP was elevated, even on recheck. Educated on the impact of uncontrolled BP. Provided with handouts on DASH and BP pt knowledge sheet. Explained that she could try diet and exercise to see if that would help, but that she might need to stay on medication. Advised to continue the medication for now and we will reassess her progress in the next month at her annual along with EKG and labs to assess kidneys.  - CBC - COMPLETE METABOLIC PANEL WITH GFR - triamterene-hydrochlorothiazide (MAXZIDE-25) 37.5-25 MG tablet; Take 1 tablet by mouth daily.  Dispense: 30 tablet; Refill: 4  3. Healthcare maintenance Discussed needs: mammogram, colonoscopy, labs, eye and dentist checks.  She is going to get dentist appt, we will help her find an eye dr due to complaint of blurred vision on ROS. We will help set up mammo for her to have soon, and review cologuard with her at annual.  She reports having HIV screening in past, however I can not find this. I  will review with her at annual.  Until then will order along with cbc, cmp, lipids, tsh, a1c, and vit d.   4. Blurred vision During ROS she reported some blurred vision, she does wear readers, but otherwise nothing. Will help her get in with optometry with referral. Will check vision at annual.     Follow-up:  One month for annual, labs a week before  Freddy FinnerHannah M , NP

## 2018-09-10 NOTE — Patient Instructions (Signed)
    Thank you for coming into the office today. I appreciate the opportunity to provide you with the care for your health and wellness.  Today we discussed lifestyle changes. I am excited for you and your journey to wellness. I would like you to work on the [employer] and starting some form of exercise.  I have ordered several labs like we talked about today in the office. These need to be fasting. Please get these at least a week before the annual visit.   Would like to see you back in about a month (please schedule this around your work) for your annual visit with a pap smear.  It was a pleasure to see you and I look forward to continuing to work together on your health and well-being. Please do not hesitate to call the office if you need care or have questions about your care.  Have a wonderful day and week.  With Gratitude,  Tereasa Coop, DNP, AGNP-BC

## 2018-09-15 NOTE — Progress Notes (Signed)
Called no answer left patient a message

## 2018-09-24 NOTE — Progress Notes (Signed)
Called patient, no answer, left a message for her to return our call.

## 2018-09-25 DIAGNOSIS — I10 Essential (primary) hypertension: Secondary | ICD-10-CM | POA: Diagnosis not present

## 2018-09-26 LAB — CBC
HCT: 41.9 % (ref 35.0–45.0)
Hemoglobin: 14.3 g/dL (ref 11.7–15.5)
MCH: 28.7 pg (ref 27.0–33.0)
MCHC: 34.1 g/dL (ref 32.0–36.0)
MCV: 84 fL (ref 80.0–100.0)
MPV: 10.6 fL (ref 7.5–12.5)
Platelets: 300 10*3/uL (ref 140–400)
RBC: 4.99 10*6/uL (ref 3.80–5.10)
RDW: 13.3 % (ref 11.0–15.0)
WBC: 5.6 10*3/uL (ref 3.8–10.8)

## 2018-09-26 LAB — COMPLETE METABOLIC PANEL WITH GFR
AG Ratio: 1.3 (calc) (ref 1.0–2.5)
ALKALINE PHOSPHATASE (APISO): 63 U/L (ref 37–153)
ALT: 16 U/L (ref 6–29)
AST: 35 U/L (ref 10–35)
Albumin: 4.3 g/dL (ref 3.6–5.1)
BUN: 15 mg/dL (ref 7–25)
CO2: 27 mmol/L (ref 20–32)
CREATININE: 0.95 mg/dL (ref 0.50–1.05)
Calcium: 9.6 mg/dL (ref 8.6–10.4)
Chloride: 103 mmol/L (ref 98–110)
GFR, Est African American: 80 mL/min/{1.73_m2} (ref >=60)
GFR, Est Non African American: 69 mL/min/{1.73_m2} (ref >=60)
Globulin: 3.2 g/dL (calc) (ref 1.9–3.7)
Glucose, Bld: 86 mg/dL (ref 65–99)
Potassium: 3.8 mmol/L (ref 3.5–5.3)
SODIUM: 138 mmol/L (ref 135–146)
Total Bilirubin: 0.4 mg/dL (ref 0.2–1.2)
Total Protein: 7.5 g/dL (ref 6.1–8.1)

## 2018-09-26 LAB — HEMOGLOBIN A1C
Hgb A1c MFr Bld: 5.9 % of total Hgb — ABNORMAL HIGH (ref <5.7)
Mean Plasma Glucose: 123 (calc)
eAG (mmol/L): 6.8 (calc)

## 2018-09-26 LAB — VITAMIN D 25 HYDROXY (VIT D DEFICIENCY, FRACTURES): Vit D, 25-Hydroxy: 17 ng/mL — ABNORMAL LOW (ref 30–100)

## 2018-09-26 LAB — LIPID PANEL
CHOL/HDL RATIO: 4.5 (calc) (ref <5.0)
Cholesterol: 189 mg/dL (ref <200)
HDL: 42 mg/dL — AB (ref >=50)
LDL CHOLESTEROL (CALC): 113 mg/dL — AB
NON-HDL CHOLESTEROL (CALC): 147 mg/dL — AB (ref <130)
TRIGLYCERIDES: 215 mg/dL — AB (ref <150)

## 2018-09-26 LAB — TSH: TSH: 1.68 m[IU]/L

## 2018-10-16 ENCOUNTER — Telehealth: Payer: Self-pay | Admitting: *Deleted

## 2018-10-16 ENCOUNTER — Telehealth: Payer: Self-pay

## 2018-10-16 NOTE — Telephone Encounter (Signed)
Patient's preferred pharmacy changed to [facility]

## 2018-10-16 NOTE — Telephone Encounter (Signed)
Pt called stating due to her insurance all prescriptions need to be sent to [facility] and they now need to be a 90 day supply. Stated she is not out of any medications this was just an FYI for any refills or new prescriptions.

## 2018-10-22 ENCOUNTER — Other Ambulatory Visit: Payer: Self-pay | Admitting: Family Medicine

## 2018-10-23 ENCOUNTER — Ambulatory Visit (INDEPENDENT_AMBULATORY_CARE_PROVIDER_SITE_OTHER): Payer: BLUE CROSS/BLUE SHIELD | Admitting: Family Medicine

## 2018-10-23 ENCOUNTER — Telehealth: Payer: Self-pay | Admitting: Family Medicine

## 2018-10-23 ENCOUNTER — Other Ambulatory Visit: Payer: Self-pay

## 2018-10-23 ENCOUNTER — Encounter: Payer: Self-pay | Admitting: Family Medicine

## 2018-10-23 DIAGNOSIS — E782 Mixed hyperlipidemia: Secondary | ICD-10-CM | POA: Diagnosis not present

## 2018-10-23 DIAGNOSIS — E781 Pure hyperglyceridemia: Secondary | ICD-10-CM | POA: Diagnosis not present

## 2018-10-23 DIAGNOSIS — R7303 Prediabetes: Secondary | ICD-10-CM

## 2018-10-23 DIAGNOSIS — E669 Obesity, unspecified: Secondary | ICD-10-CM | POA: Diagnosis not present

## 2018-10-23 DIAGNOSIS — I10 Essential (primary) hypertension: Secondary | ICD-10-CM

## 2018-10-23 DIAGNOSIS — E559 Vitamin D deficiency, unspecified: Secondary | ICD-10-CM

## 2018-10-23 MED ORDER — TRIAMTERENE-HCTZ 37.5-25 MG PO TABS: 1.0000 | ORAL_TABLET | Freq: Every day | ORAL | 3 refills | Status: DC

## 2018-10-23 NOTE — Progress Notes (Signed)
Virtual Visit  I connected with Amy Frazier on 10/23/18 at 8:20 am by telemedicine. I verified that I am speaking with the correct person using two identifiers. Patient location: Home I discussed the limitations of evaluation and management by telemedicine and the availability of in person appointments.  The patient expressed understanding and agreed to proceed. Provider: In office  History of Present Illness:  Amy Frazier was set up for follow-up for her lab work, blood pressure, weight. Lab results were given during discussion.    Lipid panel: cholesterol was within range, triglycerides elevated at 215, LDL elevated at 113.  HDL is low at 42.   Also advised that her A1c was 5.9%, previous check several years ago was 6% she has stayed within the prediabetic range.   Additionally her vitamin D level is 17.  She had purchase vitamin D3 after finding out her results several weeks back.   She has been taking vitamin D 3: 2000 mg daily.    She reports that she does not eat the best diet.  She recalls pulling the pamphlets that we gave her at her last visit.   She would like to read through those some more. And help herself implement some lifestyle changes before being  put on any medications. [name] verified understanding of her results.  Past Medical, Surgical, Social History, Allergies, and Medications have been Reviewed.  ROS: Review of Systems  Constitutional: Negative for chills and fever.  HENT: Negative for congestion, sinus pain and sore throat.   Eyes: Negative.   Respiratory: Negative for cough, shortness of breath and wheezing.   Cardiovascular: Negative for chest pain, palpitations and leg swelling.  Gastrointestinal: Negative.   Genitourinary: Negative.   Musculoskeletal: Negative.   Neurological: Negative for dizziness and headaches.  Endo/Heme/Allergies: Negative.   Psychiatric/Behavioral: Negative.   All other systems reviewed and are negative.     Labs  Recent Results (from the past 2160 hour(s))  Lipid Profile     Status: Abnormal   Collection Time: 09/25/18  9:56 AM  Result Value Ref Range   Cholesterol 189 <200 mg/dL   HDL 42 (L) > OR = 50 mg/dL   Triglycerides 643 (H) <150 mg/dL    Comment: . If a non-fasting specimen was collected, consider repeat triglyceride testing on a fasting specimen if clinically indicated.  Perry Mount et al. J. of Clin. Lipidol. 2015;9:129-169. Marland Kitchen    LDL Cholesterol (Calc) 113 (H) mg/dL (calc)    Comment: Reference range: <100 . Desirable range <100 mg/dL for primary prevention;   <70 mg/dL for patients with CHD or diabetic patients  with > or = 2 CHD risk factors. Marland Kitchen LDL-C is now calculated using the Martin-Hopkins  calculation, which is a validated novel method providing  better accuracy than the Friedewald equation in the  estimation of LDL-C.  Horald Pollen et al. Lenox Ahr. 3295;188(41): 2061-2068  (http://education.[facility].com/faq/FAQ164)    Total CHOL/HDL Ratio 4.5 <5.0 (calc)   Non-HDL Cholesterol (Calc) 147 (H) <130 mg/dL (calc)    Comment: For patients with diabetes plus 1 major ASCVD risk  factor, treating to a non-HDL-C goal of <100 mg/dL  (LDL-C of <66 mg/dL) is considered a therapeutic  option.   HgB A1c     Status: Abnormal   Collection Time: 09/25/18  9:56 AM  Result Value Ref Range   Hgb A1c MFr Bld 5.9 (H) <5.7 % of total Hgb    Comment: For someone without known diabetes, a hemoglobin  A1c value between 5.7% and 6.4% is consistent with prediabetes and should be confirmed with a  follow-up test. . For someone with known diabetes, a value <7% indicates that their diabetes is well controlled. A1c targets should be individualized based on duration of diabetes, age, comorbid conditions, and other considerations. . This assay result is consistent with an increased risk of diabetes. . Currently, no consensus exists regarding use of hemoglobin A1c for diagnosis of  diabetes for children. .    Mean Plasma Glucose 123 (calc)   eAG (mmol/L) 6.8 (calc)  CBC     Status: None   Collection Time: 09/25/18  9:56 AM  Result Value Ref Range   WBC 5.6 3.8 - 10.8 Thousand/uL   RBC 4.99 3.80 - 5.10 Million/uL   Hemoglobin 14.3 11.7 - 15.5 g/dL   HCT 09.8 11.9 - 14.7 %   MCV 84.0 80.0 - 100.0 fL   MCH 28.7 27.0 - 33.0 pg   MCHC 34.1 32.0 - 36.0 g/dL   RDW 82.9 56.2 - 13.0 %   Platelets 300 140 - 400 Thousand/uL   MPV 10.6 7.5 - 12.5 fL  COMPLETE METABOLIC PANEL WITH GFR     Status: None   Collection Time: 09/25/18  9:56 AM  Result Value Ref Range   Glucose, Bld 86 65 - 99 mg/dL    Comment: .            Fasting reference interval .    BUN 15 7 - 25 mg/dL   Creat 8.65 7.84 - 6.96 mg/dL    Comment: For patients >26 years of age, the reference limit for Creatinine is approximately 13% higher for people identified as African-American. .    GFR, Est Non African American 69 > OR = 60 mL/min/1.50m2   GFR, Est African American 80 > OR = 60 mL/min/1.62m2   BUN/Creatinine Ratio NOT APPLICABLE 6 - 22 (calc)   Sodium 138 135 - 146 mmol/L   Potassium 3.8 3.5 - 5.3 mmol/L   Chloride 103 98 - 110 mmol/L   CO2 27 20 - 32 mmol/L   Calcium 9.6 8.6 - 10.4 mg/dL   Total Protein 7.5 6.1 - 8.1 g/dL   Albumin 4.3 3.6 - 5.1 g/dL   Globulin 3.2 1.9 - 3.7 g/dL (calc)   AG Ratio 1.3 1.0 - 2.5 (calc)   Total Bilirubin 0.4 0.2 - 1.2 mg/dL   Alkaline phosphatase (APISO) 63 37 - 153 U/L   AST 35 10 - 35 U/L   ALT 16 6 - 29 U/L  TSH     Status: None   Collection Time: 09/25/18  9:56 AM  Result Value Ref Range   TSH 1.68 mIU/L    Comment:           Reference Range .           > or = 20 Years  0.40-4.50 .                Pregnancy Ranges           First trimester    0.26-2.66           Second trimester   0.55-2.73           Third trimester    0.43-2.91   Vitamin D (25 hydroxy)     Status: Abnormal   Collection Time: 09/25/18  9:56 AM  Result Value Ref Range   Vit  D, 25-Hydroxy 17 (L) 30 - 100 ng/mL  Comment: Vitamin D Status         25-OH Vitamin D: . Deficiency:                    <20 ng/mL Insufficiency:             20 - 29 ng/mL Optimal:                 > or = 30 ng/mL . For 25-OH Vitamin D testing on patients on  D2-supplementation and patients for whom quantitation  of D2 and D3 fractions is required, the [facility](TM) 25-OH VIT D, (D2,D3), LC/MS/MS is recommended: order  code 86578 (patients >81yrs). . For more information on this test, go to: http://education.[facility].com/faq/FAQ163 (This link is being provided for  informational/educational purposes only.)     Assessment and Plan: 1. Essential hypertension Unable to assess BP Will maintain current BP medication.  Recommend exercise at least 150 minutes/week accumulative for moderate intensity is brisk walking.   For example 30 minutes on lunch break.  15 minutes in the morning.  15 minutes in the evening.  As long as the cumulative total is 150 minutes. Diet: Follow the dietary approaches to stop hypertension-DASH DIET and to lower-fat for your cholesterol.   A.  Decreased total fat calories and cholesterol.  B.  Decreased total saturated fats, and replaced with monounsaturated fats such as canola oil all of oil or margarine.  C.  Increase fiber with oatmeal, bran, fiber supplements. D.  Increase daily intake of fruits and vegetables.  Be mindful that canned fruits and canned vegetables could have   added sugar and salt.  E.  Tried garlic, soy protein and vitamin C to help lower your LDL cholesterol.  2. Obesity (BMI 35.0-39.9 without comorbidity) Encouraged her to start a walking program.  Walking 15 to 20 minutes 2 to 3 days a week.   Increasing her time in number of days to 30 minutes at least 5 days of the week. Limit red meats, egg yolks, cheese and creamy items such as ice cream, creamy sauces, soups and dressings, substituting instead with oil-based salad  dressings, broth-based soups, etc.  Avoid fatty/greasy foods and sweets.  3. Moderate mixed hyperlipidemia not requiring statin therapy Detailed the correct range of cholesterol levels.  And how to help boost HDL and reduce her LDL and triglyceride levels. Limit red meats, egg yolks, cheese and creamy items such as ice cream, creamy sauces, soups and dressings, substituting instead with oil-based salad dressings, broth-based soups, etc.  Avoid fatty/greasy foods and sweets.  4. Hypertriglyceridemia, essential Detailed the correct range of cholesterol levels.  And how to help boost HDL and reduce her LDL and triglyceride levels. Limit red meats, egg yolks, cheese and creamy items such as ice cream, creamy sauces, soups and dressings, substituting instead with oil-based salad dressings, broth-based soups, etc.  Avoid fatty/greasy foods and sweets.  5. Vitamin D deficiency Advised her to take 3 of her vitamin D 3 tablets daily for the next 8 weeks.  6. Prediabetes  Educated her on diet changes that could not only help with her cholesterol and her prevention of diabetes. Recommend increasing activity level see above.   Follow Up Instructions:  Follow-up in 3 months. I discussed the assessment and treatment plan with the patient. The patient was provided an opportunity to ask questions and all were answered.  The patient agreed with the plan and demonstrated an understanding of the instructions.    I provided  15 minutes of non-face-to-face time during this encounter.   Freddy Finner, NP

## 2018-10-23 NOTE — Telephone Encounter (Signed)
error 

## 2019-01-20 ENCOUNTER — Encounter: Payer: Self-pay | Admitting: Family Medicine

## 2019-01-20 ENCOUNTER — Ambulatory Visit (INDEPENDENT_AMBULATORY_CARE_PROVIDER_SITE_OTHER): Payer: BC Managed Care – PPO | Admitting: Family Medicine

## 2019-01-20 ENCOUNTER — Other Ambulatory Visit: Payer: Self-pay

## 2019-01-20 VITALS — Ht 65.0 in | Wt 218.0 lb

## 2019-01-20 DIAGNOSIS — E669 Obesity, unspecified: Secondary | ICD-10-CM | POA: Diagnosis not present

## 2019-01-20 DIAGNOSIS — I10 Essential (primary) hypertension: Secondary | ICD-10-CM

## 2019-01-20 NOTE — Patient Instructions (Addendum)
Thank you for completing your appointment via telemedicine. I appreciate the opportunity to provide you with the care for your health and wellness. Today we discussed: Blood pressure and weight  Follow-up: 6 months for annual.  No labs ordered for today.  Will get updated labs at your next appointment.  Please let us know if you need us before the next appointment do not hesitate to reach out.  We are here for you.  Additionally I have included diet information and exercise information for your review. Look forward to seeing you in December.  Please continue to practice social distancing during this time.  If you would like to get your flu shot before your annual visit in December please just call the office and we can get you scheduled in for that.  WASH YOUR HANDS WELL AND FREQUENTLY. AVOID TOUCHING YOUR FACE, UNLESS YOUR HANDS ARE FRESHLY WASHED.  GET FRESH AIR DAILY. STAY HYDRATED WITH WATER.   It was a pleasure to see you and I look forward to continuing to work together on your health and well-being. Please do not hesitate to call the office if you need care or have questions about your care.  Have a wonderful day and week.  With Gratitude,  Tereasa CoopHannah Mekhi Lascola, DNP, AGNP-BC  [facility]  Walking is a great form of exercise to increase your strength, endurance and overall fitness.  A walking program can help you start slowly and gradually build endurance as you go.  Everyone's ability is different, so each person's starting point will be different.  You do not have to follow them exactly.  The are just samples. You should simply find out what's right for you and stick to that program.   In the beginning, you'll start off walking 2-3 times a day for short distances.  As you get stronger, you'll be walking further at just 1-2 times per day.  A. You Can Walk For A Certain Length Of Time Each Day    Walk 5 minutes 3 times per day.  Increase 2 minutes every 2 days (3 times per  day).  Work up to 25-30 minutes (1-2 times per day).   Example:   Day 1-2 5 minutes 3 times per day   Day 7-8 12 minutes 2-3 times per day   Day 13-14 25 minutes 1-2 times per day  B. You Can Walk For a Certain Distance Each Day     Distance can be substituted for time.    Example:   3 trips to mailbox (at road)   3 trips to corner of block   3 trips around the block  C. Go to local high school and use the track.       Calorie Counting for Weight Loss Calories are units of energy. Your body needs a certain amount of calories from food to keep you going throughout the day. When you eat more calories than your body needs, your body stores the extra calories as fat. When you eat fewer calories than your body needs, your body burns fat to get the energy it needs. Calorie counting means keeping track of how many calories you eat and drink each day. Calorie counting can be helpful if you need to lose weight. If you make sure to eat fewer calories than your body needs, you should lose weight. Ask your health care provider what a healthy weight is for you. For calorie counting to work, you will need to eat the right number of  calories in a day in order to lose a healthy amount of weight per week. A dietitian can help you determine how many calories you need in a day and will give you suggestions on how to reach your calorie goal.  A healthy amount of weight to lose per week is usually 1-2 lb (0.5-0.9 kg). This usually means that your daily calorie intake should be reduced by 500-750 calories.  Eating 1,200 - 1,500 calories per day can help most women lose weight.  Eating 1,500 - 1,800 calories per day can help most men lose weight.  What do I need to know about calorie counting? In order to meet your daily calorie goal, you will need to:  Find out how many calories are in each food you would like to eat. Try to do this before you eat.  Decide how much of the food you plan to  eat.  Write down what you ate and how many calories it had. Doing this is called keeping a food log. To successfully lose weight, it is important to balance calorie counting with a healthy lifestyle that includes regular activity. Aim for 150 minutes of moderate exercise (such as walking) or 75 minutes of vigorous exercise (such as running) each week. Where do I find calorie information?  The number of calories in a food can be found on a Nutrition Facts label. If a food does not have a Nutrition Facts label, try to look up the calories online or ask your dietitian for help. Remember that calories are listed per serving. If you choose to have more than one serving of a food, you will have to multiply the calories per serving by the amount of servings you plan to eat. For example, the label on a package of bread might say that a serving size is 1 slice and that there are 90 calories in a serving. If you eat 1 slice, you will have eaten 90 calories. If you eat 2 slices, you will have eaten 180 calories. How do I keep a food log? Immediately after each meal, record the following information in your food log:  What you ate. Don't forget to include toppings, sauces, and other extras on the food.  How much you ate. This can be measured in cups, ounces, or number of items.  How many calories each food and drink had.  The total number of calories in the meal. Keep your food log near you, such as in a small notebook in your pocket, or use a mobile app or website. Some programs will calculate calories for you and show you how many calories you have left for the day to meet your goal. What are some calorie counting tips?   Use your calories on foods and drinks that will fill you up and not leave you hungry: ? Some examples of foods that fill you up are nuts and nut butters, vegetables, lean proteins, and high-fiber foods like whole grains. High-fiber foods are foods with more than 5 g fiber per  serving. ? Drinks such as sodas, specialty coffee drinks, alcohol, and juices have a lot of calories, yet do not fill you up.  Eat nutritious foods and avoid empty calories. Empty calories are calories you get from foods or beverages that do not have many vitamins or protein, such as candy, sweets, and soda. It is better to have a nutritious high-calorie food (such as an avocado) than a food with few nutrients (such as a bag of chips).  Know how many calories are in the foods you eat most often. This will help you calculate calorie counts faster.  Pay attention to calories in drinks. Low-calorie drinks include water and unsweetened drinks.  Pay attention to nutrition labels for "low fat" or "fat free" foods. These foods sometimes have the same amount of calories or more calories than the full fat versions. They also often have added sugar, starch, or salt, to make up for flavor that was removed with the fat.  Find a way of tracking calories that works for you. Get creative. Try different apps or programs if writing down calories does not work for you. What are some portion control tips?  Know how many calories are in a serving. This will help you know how many servings of a certain food you can have.  Use a measuring cup to measure serving sizes. You could also try weighing out portions on a kitchen scale. With time, you will be able to estimate serving sizes for some foods.  Take some time to put servings of different foods on your favorite plates, bowls, and cups so you know what a serving looks like.  Try not to eat straight from a bag or box. Doing this can lead to overeating. Put the amount you would like to eat in a cup or on a plate to make sure you are eating the right portion.  Use smaller plates, glasses, and bowls to prevent overeating.  Try not to multitask (for example, watch TV or use your computer) while eating. If it is time to eat, sit down at a table and enjoy your food.  This will help you to know when you are full. It will also help you to be aware of what you are eating and how much you are eating. What are tips for following this plan? Reading food labels  Check the calorie count compared to the serving size. The serving size may be smaller than what you are used to eating.  Check the source of the calories. Make sure the food you are eating is high in vitamins and protein and low in saturated and trans fats. Shopping  Read nutrition labels while you shop. This will help you make healthy decisions before you decide to purchase your food.  Make a grocery list and stick to it. Cooking  Try to cook your favorite foods in a healthier way. For example, try baking instead of frying.  Use low-fat dairy products. Meal planning  Use more fruits and vegetables. Half of your plate should be fruits and vegetables.  Include lean proteins like poultry and fish. How do I count calories when eating out?  Ask for smaller portion sizes.  Consider sharing an entree and sides instead of getting your own entree.  If you get your own entree, eat only half. Ask for a box at the beginning of your meal and put the rest of your entree in it so you are not tempted to eat it.  If calories are listed on the menu, choose the lower calorie options.  Choose dishes that include vegetables, fruits, whole grains, low-fat dairy products, and lean protein.  Choose items that are boiled, broiled, grilled, or steamed. Stay away from items that are buttered, battered, fried, or served with cream sauce. Items labeled "crispy" are usually fried, unless stated otherwise.  Choose water, low-fat milk, unsweetened iced tea, or other drinks without added sugar. If you want an alcoholic beverage, choose a lower calorie option  such as a glass of wine or light beer.  Ask for dressings, sauces, and syrups on the side. These are usually high in calories, so you should limit the amount you  eat.  If you want a salad, choose a garden salad and ask for grilled meats. Avoid extra toppings like bacon, cheese, or fried items. Ask for the dressing on the side, or ask for olive oil and vinegar or lemon to use as dressing.  Estimate how many servings of a food you are given. For example, a serving of cooked rice is  cup or about the size of half a baseball. Knowing serving sizes will help you be aware of how much food you are eating at restaurants. The list below tells you how big or small some common portion sizes are based on everyday objects: ? 1 oz--4 stacked dice. ? 3 oz--1 deck of cards. ? 1 tsp--1 die. ? 1 Tbsp-- a ping-pong ball. ? 2 Tbsp--1 ping-pong ball. ?  cup-- baseball. ? 1 cup--1 baseball. Summary  Calorie counting means keeping track of how many calories you eat and drink each day. If you eat fewer calories than your body needs, you should lose weight.  A healthy amount of weight to lose per week is usually 1-2 lb (0.5-0.9 kg). This usually means reducing your daily calorie intake by 500-750 calories.  The number of calories in a food can be found on a Nutrition Facts label. If a food does not have a Nutrition Facts label, try to look up the calories online or ask your dietitian for help.  Use your calories on foods and drinks that will fill you up, and not on foods and drinks that will leave you hungry.  Use smaller plates, glasses, and bowls to prevent overeating. This information is not intended to replace advice given to you by your health care provider. Make sure you discuss any questions you have with your health care provider. Document Released: 07/22/2005 Document Revised: 04/10/2018 Document Reviewed: 06/21/2016 [employer] Interactive Patient Education  2019 [employer]

## 2019-01-20 NOTE — Progress Notes (Signed)
Virtual Visit via Telephone Note   This visit type was conducted due to national recommendations for restrictions regarding the COVID-19 Pandemic (e.g. social distancing) in an effort to limit this patient's exposure and mitigate transmission in our community.  Due to her co-morbid illnesses, this patient is at least at moderate risk for complications without adequate follow up.  This format is felt to be most appropriate for this patient at this time.  The patient did not have access to video technology/had technical difficulties with video requiring transitioning to audio format only (telephone).  All issues noted in this document were discussed and addressed.  No physical exam could be performed with this format.    Evaluation Performed:  Follow-up visit  Date:  01/20/2019   ID:  Amy Frazier, DOB 05/01/1966, MRN [id number]  Patient Location: Home Provider Location: Other:  telemedicine  Location of Patient: Home Location of Provider: Telehealth Consent was obtain for visit to be over via telehealth. I verified that I am speaking with the correct person using two identifiers.  PCP:  Freddy FinnerMills, Lauree Yurick M, NP   Chief Complaint:  Blood pressure and obesity  History of Present Illness:    Amy FoundLeathie Mccampbell is a 53 y.o. female with history of hypertension and obesity.  Is here for follow-up on these.  Additionally has vitamin D deficiency.  Is on vitamin D tablets.  In addition to [facility] blood pressure control.  Is not sure what her most recent blood pressure is.  Does report taking her medications as directed.  And does not have any issues with them.  Denies having any vision changes, dizziness, chest pain, headaches, palpitations, leg swelling.  Reports that she has been eating a decent diet and does not feel that she is put on any weight.  She has continued to work even with the COVID as her job is essential.  So she has not been stuck at home and or felt that she is over ate during this  time.  Additionally she has not been able to get much of a workout in either.  The patient does not have symptoms concerning for COVID-19 infection (fever, chills, cough, or new shortness of breath).   Past Medical, Surgical, Social History, Allergies, and Medications have been Reviewed.    Past Medical History:  Diagnosis Date  . Anxiety   . Hypertension   . Obesity   . PCO (polycystic ovaries) 1999   Past Surgical History:  Procedure Laterality Date  . ECTOPIC PREGNANCY SURGERY       Current Meds  Medication Sig  . Cholecalciferol (VITAMIN D) 50 MCG (2000 UT) tablet Take 6,000 Units by mouth daily.  Marland Kitchen. triamterene-hydrochlorothiazide (MAXZIDE-25) 37.5-25 MG tablet Take 1 tablet by mouth daily.     Allergies:   Patient has no known allergies.   Social History   Tobacco Use  . Smoking status: Never Smoker  . Smokeless tobacco: Never Used  Substance Use Topics  . Alcohol use: No  . Drug use: No     Family Hx: The patient's family history includes Diabetes in her mother; Heart disease in her mother; Hypertension in her father; Obesity in her mother.  ROS:   Please see the history of present illness.    Review of Systems  Constitutional: Negative for activity change, appetite change, chills and fever.  HENT: Negative.   Eyes: Negative for visual disturbance.  Respiratory: Negative for cough and shortness of breath.   Cardiovascular: Negative for  chest pain, palpitations and leg swelling.  Gastrointestinal: Negative.   Endocrine: Negative.   Genitourinary: Negative.   Musculoskeletal: Negative.   Skin: Negative.   Allergic/Immunologic: Negative.   Neurological: Negative for dizziness and headaches.  Hematological: Negative.   Psychiatric/Behavioral: Negative.    All other systems reviewed and are negative.   Labs/Other Tests and Data Reviewed:     Recent Labs: 09/25/2018: ALT 16; BUN 15; Creat 0.95; Hemoglobin 14.3; Platelets 300; Potassium 3.8; Sodium 138;  TSH 1.68   Recent Lipid Panel Lab Results  Component Value Date/Time   CHOL 189 09/25/2018 09:56 AM   TRIG 215 (H) 09/25/2018 09:56 AM   HDL 42 (L) 09/25/2018 09:56 AM   CHOLHDL 4.5 09/25/2018 09:56 AM   LDLCALC 113 (H) 09/25/2018 09:56 AM    Wt Readings from Last 3 Encounters:  01/20/19 218 lb (98.9 kg)  09/10/18 218 lb (98.9 kg)  12/23/13 215 lb (97.5 kg)     Objective:    Vital Signs:  Ht 5\' 5"  (1.651 m)   Wt 218 lb (98.9 kg)   BMI 36.28 kg/m    VITAL SIGNS:  reviewed GEN:  alert and oriented RESPIRATORY:  no noted shortness of breath in conversation PSYCH:  normal affect, mood, and good communication  ASSESSMENT & PLAN:    1. Obesity (BMI 35.0-39.9 without comorbidity) Encouraged her to start a walking program.  Walking 15 to 20 minutes 2 to 3 days a week.   Increasing her time in number of days to 30 minutes at least 5 days of the week. Limit red meats, egg yolks, cheese and creamy items such as ice cream, creamy sauces, soups and dressings, substituting instead with oil-based salad dressings, broth-based soups, etc.  Avoid fatty/greasy foods and sweets.  2. Essential hypertension Unable to assess BP Will maintain current BP medication.  Recommend exercise at least 150 minutes/week accumulative for moderate intensity is brisk walking.   For example 30 minutes on lunch break.  15 minutes in the morning.  15 minutes in the evening.  As long as the cumulative total is 150 minutes. Diet: Follow the dietary approaches to stop hypertension-DASH DIET and to lower-fat for your cholesterol.              A.  Decreased total fat calories and cholesterol.             B.  Decreased total saturated fats, and replaced with monounsaturated fats such as canola oil all of oil or margarine.             C.  Increase fiber with oatmeal, bran, fiber supplements. D.  Increase daily intake of fruits and vegetables.  Be mindful that canned fruits and canned vegetables could have   added  sugar and salt.             E.  Tried garlic, soy protein and vitamin C to help lower your LDL cholesterol.    Time:   Today, I have spent 10 minutes with the patient with telehealth technology discussing the above problems.     Medication Adjustments/Labs and Tests Ordered: Current medicines are reviewed at length with the patient today.  Concerns regarding medicines are outlined above.   Tests Ordered: No orders of the defined types were placed in this encounter.   Medication Changes: No orders of the defined types were placed in this encounter.   Disposition:  Follow up in 6 month(s) for annual  Signed, Perlie Mayo, NP  01/20/2019  10:29 AM     [facility]

## 2019-01-26 ENCOUNTER — Ambulatory Visit: Payer: Self-pay | Admitting: Family Medicine

## 2019-03-09 ENCOUNTER — Other Ambulatory Visit: Payer: Self-pay

## 2019-03-09 ENCOUNTER — Encounter: Payer: Self-pay | Admitting: Family Medicine

## 2019-03-09 ENCOUNTER — Ambulatory Visit (INDEPENDENT_AMBULATORY_CARE_PROVIDER_SITE_OTHER): Payer: BC Managed Care – PPO | Admitting: Family Medicine

## 2019-03-09 VITALS — BP 119/82 | HR 91 | Temp 99.0°F | Resp 12 | Ht 65.0 in | Wt 210.1 lb

## 2019-03-09 DIAGNOSIS — L237 Allergic contact dermatitis due to plants, except food: Secondary | ICD-10-CM

## 2019-03-09 DIAGNOSIS — E559 Vitamin D deficiency, unspecified: Secondary | ICD-10-CM | POA: Diagnosis not present

## 2019-03-09 MED ORDER — CLOBETASOL PROPIONATE 0.05 % EX CREA: 1.0000 "application " | TOPICAL_CREAM | Freq: Two times a day (BID) | CUTANEOUS | 0 refills | Status: DC

## 2019-03-09 MED ORDER — PREDNISONE 10 MG (21) PO TBPK: ORAL_TABLET | ORAL | 0 refills | Status: DC

## 2019-03-09 MED ORDER — VITAMIN D (ERGOCALCIFEROL) 1.25 MG (50000 UNIT) PO CAPS: ORAL_CAPSULE | ORAL | 1 refills | Status: DC

## 2019-03-09 NOTE — Progress Notes (Signed)
Subjective:     Patient ID: Amy Frazier Osbourne, female   DOB: 1966-01-01, 53 y.o.   MRN: [id number]  Amy Frazier Haddon presents for Poison Ivy (noticed July 30th, on legs, arms, ) Today Amy Frazier comes in with poison ivy rash on both inner arms and left leg.  She reports itching all over including her scalp now. She has this plant in her yard and became exposed  While trying to clear the area of weeds. She recently got a baby goat, named [place] ConstableFreddie G (GOAT) but he is not eating the weeds yet. She tried to use Technu and Diluted Bleach to clear it over the weekend. She reports this being the only concern today.  Today patient denies signs and symptoms of COVID 19 infection including fever, chills, cough, shortness of breath, and headache.  Past Medical, Surgical, Social History, Allergies, and Medications have been Reviewed.  Past Medical History:  Diagnosis Date  . Anxiety   . Hypertension   . Obesity   . PCO (polycystic ovaries) 1999   Past Surgical History:  Procedure Laterality Date  . ECTOPIC PREGNANCY SURGERY     Social History   Socioeconomic History  . Marital status: Married    Spouse name: Gabriel RungShayne -22 years   . Number of children: 0  . Years of education: 5012  . Highest education level: 12th grade  Occupational History  . Occupation: [employer]   Social Needs  . Financial resource strain: Somewhat hard  . Food insecurity    Worry: Sometimes true    Inability: Sometimes true  . Transportation needs    Medical: No    Non-medical: No  Tobacco Use  . Smoking status: Never Smoker  . Smokeless tobacco: Never Used  Substance and Sexual Activity  . Alcohol use: No  . Drug use: No  . Sexual activity: Yes    Partners: Male    Birth control/protection: None  Lifestyle  . Physical activity    Days per week: 0 days    Minutes per session: 0 min  . Stress: Only a little  Relationships  . Social connections    Talks on phone: More than three times a week    Gets  together: More than three times a week    Attends religious service: 1 to 4 times per year    Active member of club or organization: No    Attends meetings of clubs or organizations: Never    Relationship status: Married  . Intimate partner violence    Fear of current or ex partner: No    Emotionally abused: No    Physically abused: No    Forced sexual activity: No  Other Topics Concern  . Not on file  Social History Narrative   Lives alone with husband married for 22 years     Outpatient Encounter Medications as of 03/09/2019  Medication Sig  . Cholecalciferol (VITAMIN D) 50 MCG (2000 UT) tablet Take 6,000 Units by mouth daily.  Marland Kitchen. triamterene-hydrochlorothiazide (MAXZIDE-25) 37.5-25 MG tablet Take 1 tablet by mouth daily.  . clobetasol cream (TEMOVATE) 0.05 % Apply 1 application topically 2 (two) times daily.  . predniSONE (STERAPRED UNI-PAK 21 TAB) 10 MG (21) TBPK tablet Take as directed on package  . Vitamin D, Ergocalciferol, (DRISDOL) 50000 UNITS CAPS take 1 capsule every week (Patient not taking: Reported on 03/09/2019)   No facility-administered encounter medications on file as of 03/09/2019.    No Known Allergies  Review of Systems  Constitutional:  Negative for chills and fever.  HENT: Negative.   Eyes: Negative.   Respiratory: Negative.  Negative for cough and shortness of breath.   Cardiovascular: Negative.   Gastrointestinal: Negative.   Endocrine: Negative.   Genitourinary: Negative.   Musculoskeletal: Negative.   Skin: Positive for rash.  Allergic/Immunologic: Negative.   Neurological: Negative.   Hematological: Negative.   Psychiatric/Behavioral: Negative.   All other systems reviewed and are negative.      Objective:     BP 119/82   Pulse 91   Temp 99 F (37.2 C) (Oral)   Resp 12   Ht 5\' 5"  (1.651 m)   Wt 210 lb 1.9 oz (95.3 kg)   SpO2 98%   BMI 34.97 kg/m   Physical Exam Vitals signs and nursing note reviewed.  Constitutional:      Appearance:  Normal appearance. She is obese.  HENT:     Head: Normocephalic and atraumatic.     Right Ear: External ear normal.     Left Ear: External ear normal.     Nose: Nose normal.  Eyes:     General:        Right eye: No discharge.        Left eye: No discharge.     Conjunctiva/sclera: Conjunctivae normal.  Neck:     Musculoskeletal: Neck supple.  Cardiovascular:     Rate and Rhythm: Normal rate and regular rhythm.     Pulses: Normal pulses.     Heart sounds: Normal heart sounds.  Pulmonary:     Effort: Pulmonary effort is normal.     Breath sounds: Normal breath sounds.  Musculoskeletal: Normal range of motion.  Skin:    General: Skin is warm.     Findings: Rash present. Rash is vesicular.     Comments: Multiple vesicals across anterior arms and skins/ankles. None seen on face or in scalp.   Neurological:     General: No focal deficit present.     Mental Status: She is alert and oriented to person, place, and time.  Psychiatric:        Mood and Affect: Mood normal.        Behavior: Behavior normal.        Thought Content: Thought content normal.        Judgment: Judgment normal.        Assessment and Plan        1. Poison ivy dermatitis S&S are as stated above. Due to multiple areas of involvement will do a short course of  Oral steroids. As well as cream as she is miserable. Reviewed side effects, risks and benefits of medication.   Patient acknowledged agreement and understanding of the plan.   - predniSONE (STERAPRED UNI-PAK 21 TAB) 10 MG (21) TBPK tablet; Take as directed on package  Dispense: 21 tablet; Refill: 0 - clobetasol cream (TEMOVATE) 0.05 %; Apply 1 application topically 2 (two) times daily.  Dispense: 30 g; Refill: 0   Follow Up: 07/22/2019  Perlie Mayo, DNP, AGNP-BC [address], [place] [place], [place] [address] Office Hours: Mon-Thurs 8 am-5 pm; Fri 8 am-12 pm Office Phone:   [phone]  Office Fax: [phone]

## 2019-03-09 NOTE — Patient Instructions (Addendum)
    Thank you for coming into the office today. I appreciate the opportunity to provide you with the care for your health and wellness. Today we discussed: poison ivy  Follow Up: As needed   No labs or referrals today  Please take medications as prescribed. Use cream as needed (for just a few days)  Please continue to practice social distancing to keep you, your family, and our community safe.  If you must go out, please wear a Mask and practice good handwashing.  [place] YOUR HANDS WELL AND FREQUENTLY. AVOID TOUCHING YOUR FACE, UNLESS YOUR HANDS ARE FRESHLY WASHED.  GET FRESH AIR DAILY. STAY HYDRATED WITH WATER.   It was a pleasure to see you and I look forward to continuing to work together on your health and well-being. Please do not hesitate to call the office if you need care or have questions about your care.  Have a wonderful day and week. With Gratitude, Cherly Beach, DNP, AGNP-BC

## 2019-05-19 ENCOUNTER — Other Ambulatory Visit: Payer: Self-pay

## 2019-05-19 ENCOUNTER — Ambulatory Visit (INDEPENDENT_AMBULATORY_CARE_PROVIDER_SITE_OTHER): Payer: BC Managed Care – PPO

## 2019-05-19 DIAGNOSIS — Z23 Encounter for immunization: Secondary | ICD-10-CM | POA: Diagnosis not present

## 2019-07-22 ENCOUNTER — Encounter: Payer: BC Managed Care – PPO | Admitting: Family Medicine

## 2019-08-19 ENCOUNTER — Other Ambulatory Visit: Payer: Self-pay

## 2019-08-19 DIAGNOSIS — E559 Vitamin D deficiency, unspecified: Secondary | ICD-10-CM

## 2019-08-19 MED ORDER — VITAMIN D (ERGOCALCIFEROL) 1.25 MG (50000 UNIT) PO CAPS: ORAL_CAPSULE | ORAL | 0 refills | Status: DC

## 2019-11-25 ENCOUNTER — Other Ambulatory Visit: Payer: Self-pay | Admitting: *Deleted

## 2019-11-25 DIAGNOSIS — E559 Vitamin D deficiency, unspecified: Secondary | ICD-10-CM

## 2019-11-25 MED ORDER — VITAMIN D (ERGOCALCIFEROL) 1.25 MG (50000 UNIT) PO CAPS: ORAL_CAPSULE | ORAL | 0 refills | Status: DC

## 2019-12-13 ENCOUNTER — Other Ambulatory Visit: Payer: Self-pay

## 2019-12-13 DIAGNOSIS — I10 Essential (primary) hypertension: Secondary | ICD-10-CM

## 2019-12-13 MED ORDER — TRIAMTERENE-HCTZ 37.5-25 MG PO TABS: 1.0000 | ORAL_TABLET | Freq: Every day | ORAL | 1 refills | Status: DC

## 2020-02-21 ENCOUNTER — Other Ambulatory Visit: Payer: Self-pay

## 2020-02-21 DIAGNOSIS — E559 Vitamin D deficiency, unspecified: Secondary | ICD-10-CM

## 2020-02-21 MED ORDER — VITAMIN D (ERGOCALCIFEROL) 1.25 MG (50000 UNIT) PO CAPS: ORAL_CAPSULE | ORAL | 0 refills | Status: DC

## 2020-02-29 DIAGNOSIS — Z20822 Contact with and (suspected) exposure to covid-19: Secondary | ICD-10-CM | POA: Diagnosis not present

## 2020-02-29 DIAGNOSIS — Z03818 Encounter for observation for suspected exposure to other biological agents ruled out: Secondary | ICD-10-CM | POA: Diagnosis not present

## 2020-06-03 ENCOUNTER — Other Ambulatory Visit: Payer: Self-pay | Admitting: Family Medicine

## 2020-06-03 DIAGNOSIS — I10 Essential (primary) hypertension: Secondary | ICD-10-CM

## 2020-06-09 ENCOUNTER — Other Ambulatory Visit: Payer: Self-pay | Admitting: Family Medicine

## 2020-06-09 DIAGNOSIS — E559 Vitamin D deficiency, unspecified: Secondary | ICD-10-CM

## 2020-06-13 ENCOUNTER — Other Ambulatory Visit: Payer: Self-pay

## 2020-06-13 DIAGNOSIS — I10 Essential (primary) hypertension: Secondary | ICD-10-CM

## 2020-06-13 MED ORDER — TRIAMTERENE-HCTZ 37.5-25 MG PO TABS: 1.0000 | ORAL_TABLET | Freq: Every day | ORAL | 1 refills | Status: DC

## 2020-06-15 ENCOUNTER — Other Ambulatory Visit: Payer: Self-pay

## 2020-06-15 DIAGNOSIS — E559 Vitamin D deficiency, unspecified: Secondary | ICD-10-CM

## 2020-06-15 DIAGNOSIS — I10 Essential (primary) hypertension: Secondary | ICD-10-CM

## 2020-06-15 MED ORDER — TRIAMTERENE-HCTZ 37.5-25 MG PO TABS: 1.0000 | ORAL_TABLET | Freq: Every day | ORAL | 1 refills | Status: DC

## 2020-06-15 MED ORDER — VITAMIN D (ERGOCALCIFEROL) 1.25 MG (50000 UNIT) PO CAPS: ORAL_CAPSULE | ORAL | 0 refills | Status: DC

## 2020-06-20 ENCOUNTER — Telehealth: Payer: Self-pay

## 2020-06-20 NOTE — Telephone Encounter (Signed)
LVM that she is having arm pain since her covid vaccination in June. Needs appt with Dahlia Client to discuss.

## 2020-06-21 ENCOUNTER — Encounter: Payer: Self-pay | Admitting: Family Medicine

## 2020-06-21 ENCOUNTER — Other Ambulatory Visit: Payer: Self-pay

## 2020-06-21 ENCOUNTER — Telehealth (INDEPENDENT_AMBULATORY_CARE_PROVIDER_SITE_OTHER): Payer: BC Managed Care – PPO | Admitting: Family Medicine

## 2020-06-21 VITALS — Ht 65.0 in | Wt 213.0 lb

## 2020-06-21 DIAGNOSIS — M7918 Myalgia, other site: Secondary | ICD-10-CM | POA: Diagnosis not present

## 2020-06-21 DIAGNOSIS — M791 Myalgia, unspecified site: Secondary | ICD-10-CM | POA: Insufficient documentation

## 2020-06-21 NOTE — Progress Notes (Signed)
Virtual Visit via Telephone Note   This visit type was conducted due to national recommendations for restrictions regarding the COVID-19 Pandemic (e.g. social distancing) in an effort to limit this patient's exposure and mitigate transmission in our community.  Due to her co-morbid illnesses, this patient is at least at moderate risk for complications without adequate follow up.  This format is felt to be most appropriate for this patient at this time.  The patient did not have access to video technology/had technical difficulties with video requiring transitioning to audio format only (telephone).  All issues noted in this document were discussed and addressed.  No physical exam could be performed with this format.     Evaluation Performed:  Follow-up visit  Date:  06/21/2020   ID:  Amy Frazier, DOB 04-25-1966, MRN [id number]  Patient Location: Home Provider Location: Office/Clinic  Location of Patient: Home Location of Provider: Telehealth Consent was obtain for visit to be over via telehealth. I verified that I am speaking with the correct person using two identifiers.  PCP:  Freddy Finner, NP   Chief Complaint:  Bilateral arm pain  History of Present Illness:    Amy Frazier is a 54 y.o. female with   Onset arm pain since having received the COVID vaccine in June. Reports that the Left arm was hurting after shot as normal shot would cause discomfort. She reports trouble now lifting of the arm like putting on a shirt or having arm above her head. Pain hurts on the inside per her.  Her arm hers at night, cant sleep on it. Husband has tried to massage it. She is unable to reach fully with the extension of her arm. She denies having any numbness and tingling. Pain is between elbow and shoulder on both sides, but reports pain most LEFT side causing the most trouble. Denies having any trauma or injuries to the arms. She reports the onset seemed gradual and is not peaking  and causing on going issues.  She has tried lifting weights, and moving it some to help- but that seems limited in nature for relief. She reports taking ibuprofen medication but reports it didn't seem to help her. It seems dependent on activity mostly.  Denies trying heat or ice.  On the pain scale she reports beyond a 10 most days.   The patient does not have symptoms concerning for COVID-19 infection (fever, chills, cough, or new shortness of breath).   Past Medical, Surgical, Social History, Allergies, and Medications have been Reviewed.  Past Medical History:  Diagnosis Date   Anxiety    Hypertension    Obesity    PCO (polycystic ovaries) 1999   Past Surgical History:  Procedure Laterality Date   ECTOPIC PREGNANCY SURGERY       Current Meds  Medication Sig   triamterene-hydrochlorothiazide (MAXZIDE-25) 37.5-25 MG tablet Take 1 tablet by mouth daily.   Vitamin D, Ergocalciferol, (DRISDOL) 1.25 MG (50000 UNIT) CAPS capsule take 1 capsule every week     Allergies:   Patient has no known allergies.   ROS:   Please see the history of present illness.    All other systems reviewed and are negative.   Labs/Other Tests and Data Reviewed:    Recent Labs: No results found for requested labs within last 8760 hours.   Recent Lipid Panel Lab Results  Component Value Date/Time   CHOL 189 09/25/2018 09:56 AM   TRIG 215 (H) 09/25/2018 09:56 AM   HDL  42 (L) 09/25/2018 09:56 AM   CHOLHDL 4.5 09/25/2018 09:56 AM   LDLCALC 113 (H) 09/25/2018 09:56 AM    Wt Readings from Last 3 Encounters:  06/21/20 213 lb (96.6 kg)  03/09/19 210 lb 1.9 oz (95.3 kg)  01/20/19 218 lb (98.9 kg)     Objective:    Vital Signs:  Ht 5\' 5"  (1.651 m)    Wt 213 lb (96.6 kg)    LMP 06/18/2020    BMI 35.45 kg/m    VITAL SIGNS:  reviewed GEN:  no acute distress RESPIRATORY:  no shortness of breath in conversation  PSYCH:  normal affect  ASSESSMENT & PLAN:    1. Muscular pain -  Ambulatory referral to Orthopedic Surgery    Time:   Today, I have spent 7 minutes with the patient with telehealth technology discussing the above problems.     Medication Adjustments/Labs and Tests Ordered: Current medicines are reviewed at length with the patient today.  Concerns regarding medicines are outlined above.   Tests Ordered: No orders of the defined types were placed in this encounter.   Medication Changes: No orders of the defined types were placed in this encounter.   Note: This dictation was prepared with Dragon dictation along with smaller phrase technology. Similar sounding words can be transcribed inadequately or may not be corrected upon review. Any transcriptional errors that result from this process are unintentional.      Disposition:  Follow up February for annual visit Signed, March, NP  06/21/2020 9:07 AM     06/23/[facility]

## 2020-06-21 NOTE — Patient Instructions (Addendum)
  HAPPY FALL!  I appreciate the opportunity to provide you with care for your health and wellness. Today we discussed: Possible muscular pain   Follow up: Feb CPE - fasting   No labs  Referrals today: Ortho  Rest your arms as much as possible until seen by orthopedist. Use ibuprofen, heat and ice as needed and tolerated.  Happy Thanksgiving!!!  Please continue to practice social distancing to keep you, your family, and our community safe.  If you must go out, please wear a mask and practice good handwashing.  It was a pleasure to see you and I look forward to continuing to work together on your health and well-being. Please do not hesitate to call the office if you need care or have questions about your care.  Have a wonderful day and week. With Gratitude, Tereasa Coop, DNP, AGNP-BC

## 2020-06-21 NOTE — Assessment & Plan Note (Signed)
Referral to Ortho for better assessment questionable muscular versus nerve versus joint pain with radiation RICE measures reviewed.

## 2020-07-11 ENCOUNTER — Other Ambulatory Visit: Payer: Self-pay

## 2020-07-11 ENCOUNTER — Ambulatory Visit: Payer: BC Managed Care – PPO

## 2020-07-11 ENCOUNTER — Ambulatory Visit (INDEPENDENT_AMBULATORY_CARE_PROVIDER_SITE_OTHER): Payer: BC Managed Care – PPO | Admitting: Orthopedic Surgery

## 2020-07-11 ENCOUNTER — Encounter: Payer: Self-pay | Admitting: Orthopedic Surgery

## 2020-07-11 VITALS — BP 139/94 | HR 86 | Ht 65.0 in | Wt 213.0 lb

## 2020-07-11 DIAGNOSIS — M25511 Pain in right shoulder: Secondary | ICD-10-CM

## 2020-07-11 DIAGNOSIS — M7522 Bicipital tendinitis, left shoulder: Secondary | ICD-10-CM | POA: Diagnosis not present

## 2020-07-11 DIAGNOSIS — G8929 Other chronic pain: Secondary | ICD-10-CM

## 2020-07-11 DIAGNOSIS — M25512 Pain in left shoulder: Secondary | ICD-10-CM

## 2020-07-11 DIAGNOSIS — M7521 Bicipital tendinitis, right shoulder: Secondary | ICD-10-CM | POA: Diagnosis not present

## 2020-07-11 MED ORDER — DICLOFENAC-MISOPROSTOL 50-0.2 MG PO TBEC: 1.0000 | DELAYED_RELEASE_TABLET | Freq: Two times a day (BID) | ORAL | 2 refills | Status: AC

## 2020-07-11 NOTE — Patient Instructions (Signed)
1.  Treatment for biceps tendon strain or anterior rotator cuff strain 2.  Prescription sent for anti-inflammatory 3.  Home exercises provided 4.  F/u as needed

## 2020-07-11 NOTE — Progress Notes (Signed)
New Patient Visit  Assessment: Malayia Spizzirri is a 54 y.o. female with the following: 1. Bilateral shoulder pain; L>R.  Proximal biceps tendonitis vs anterior rotator cuff strain    Plan: Mrs. Aguila has bilateral shoulder pain, L>R, that initially started following her COVID vaccine.  Atraumatic pain.  Occasional sharp pain, primarily in the anterior aspect of her shoulders.  She has excellent strength and range of motion in her bilateral rotator cuff, and therefore less concerned about an injury or irritation of her rotator cuff.  Nonetheless, biceps provocative signs elicit her pain, and her pain is localized to the anterior shoulder, and into her biceps.  As a result, she has developed some irritation of the long head of the biceps tendon based on my physical exam.  I have provided her with a prescription for Arthrotec, as well as a list of home exercises that she can start immediately.  If her pain worsens, we can consider an injection in the future, however I recommended against it at this time, as her pain is not constant, and currently not bothering her significantly.  No follow-up is needed, but she can return if she has any issues.  Follow-up: Return if symptoms worsen or fail to improve.  Subjective:  Chief Complaint  Patient presents with  . Shoulder Pain    bilateral shoulder pain, hurting since June     History of Present Illness: Dezarae Mcclaran is a 53 y.o. RHD female who has been referred to clinic today by Tereasa Coop, NP for evaluation of left shoulder pain.  Briefly, she has been having pain in her bilateral shoulders since June, 2021.  Prior to this she was having no issues.  She received her Covid vaccination, and has had pain since.  The pain is primarily located in the anterior aspect of her shoulders.  It started in the left shoulder, but with time, she started develop a similar type pain in the front of her right shoulder.  She has been taking ibuprofen for pain, with  minimal sustained relief.  No physical therapy.  No previous injections.  She is never sustained an injury to either shoulder.   Review of Systems: No fevers or chills No numbness or tingling No chest pain No shortness of breath No bowel or bladder dysfunction No GI distress No headaches   Medical History:  Past Medical History:  Diagnosis Date  . Anxiety   . Hypertension   . Obesity   . PCO (polycystic ovaries) 1999    Past Surgical History:  Procedure Laterality Date  . ECTOPIC PREGNANCY SURGERY      Family History  Problem Relation Age of Onset  . Diabetes Mother   . Obesity Mother   . Heart disease Mother   . Hypertension Father    Social History   Tobacco Use  . Smoking status: Never Smoker  . Smokeless tobacco: Never Used  Substance Use Topics  . Alcohol use: No  . Drug use: No    No Known Allergies  Current Meds  Medication Sig  . triamterene-hydrochlorothiazide (MAXZIDE-25) 37.5-25 MG tablet Take 1 tablet by mouth daily.  . Vitamin D, Ergocalciferol, (DRISDOL) 1.25 MG (50000 UNIT) CAPS capsule take 1 capsule every week    Objective: BP (!) 139/94   Pulse 86   Ht 5\' 5"  (1.651 m)   Wt 213 lb (96.6 kg)   LMP 06/18/2020   BMI 35.45 kg/m   Physical Exam:  General: Alert and oriented, no acute distress Gait:  Normal  Evaluation of bilateral shoulders demonstrates no obvious deformity.  No atrophy is appreciated.  Full forward flexion to beyond 150 degrees with some discomfort in the anterior shoulder.  Abduction greater than 110 degrees.  External rotation at her side is equal bilaterally, to 45 degrees.  Internal rotation to T12.  Strength in bilateral rotator cuffs is 5/5.  Tenderness in the anterior shoulder.  Positive O'Brien's testing bilaterally, worse on the left.    IMAGING: I personally ordered and reviewed the following images  X-ray of the right shoulder demonstrates maintenance of the glenohumeral joint space.  No proximal humeral  migration.  Impression: Normal-appearing right shoulder  X-ray of the left shoulder demonstrates well-maintained glenohumeral joint space.  There is no proximal humeral migration.  Impression: Normal-appearing left shoulder.  New Medications:  Meds ordered this encounter  Medications  . Diclofenac-miSOPROStol 50-0.2 MG TBEC    Sig: Take 1 tablet by mouth 2 (two) times daily.    Dispense:  60 tablet    Refill:  2      Oliver Barre, MD  07/11/2020 3:39 PM

## 2020-07-16 ENCOUNTER — Other Ambulatory Visit: Payer: Self-pay | Admitting: Family Medicine

## 2020-07-16 DIAGNOSIS — E559 Vitamin D deficiency, unspecified: Secondary | ICD-10-CM

## 2020-09-22 ENCOUNTER — Other Ambulatory Visit: Payer: Self-pay | Admitting: Family Medicine

## 2020-09-22 DIAGNOSIS — E559 Vitamin D deficiency, unspecified: Secondary | ICD-10-CM

## 2020-12-20 ENCOUNTER — Other Ambulatory Visit: Payer: Self-pay | Admitting: Family Medicine

## 2020-12-20 DIAGNOSIS — E559 Vitamin D deficiency, unspecified: Secondary | ICD-10-CM

## 2021-03-17 ENCOUNTER — Other Ambulatory Visit: Payer: Self-pay | Admitting: Family Medicine

## 2021-03-17 DIAGNOSIS — E559 Vitamin D deficiency, unspecified: Secondary | ICD-10-CM

## 2021-03-23 ENCOUNTER — Other Ambulatory Visit: Payer: Self-pay

## 2021-03-23 ENCOUNTER — Telehealth: Payer: Self-pay

## 2021-03-23 DIAGNOSIS — I10 Essential (primary) hypertension: Secondary | ICD-10-CM

## 2021-03-23 MED ORDER — TRIAMTERENE-HCTZ 37.5-25 MG PO TABS: 1.0000 | ORAL_TABLET | Freq: Every day | ORAL | 1 refills | Status: DC

## 2021-03-23 NOTE — Telephone Encounter (Signed)
Pt informed

## 2021-03-23 NOTE — Telephone Encounter (Signed)
Patient called needing refill on her triamterene ph# [phone]

## 2021-06-16 ENCOUNTER — Other Ambulatory Visit: Payer: Self-pay | Admitting: Family Medicine

## 2021-06-16 DIAGNOSIS — E559 Vitamin D deficiency, unspecified: Secondary | ICD-10-CM

## 2021-09-15 ENCOUNTER — Other Ambulatory Visit: Payer: Self-pay | Admitting: Family Medicine

## 2021-09-15 DIAGNOSIS — E559 Vitamin D deficiency, unspecified: Secondary | ICD-10-CM

## 2021-09-19 ENCOUNTER — Other Ambulatory Visit: Payer: Self-pay | Admitting: Family Medicine

## 2021-09-19 DIAGNOSIS — I10 Essential (primary) hypertension: Secondary | ICD-10-CM

## 2021-11-28 ENCOUNTER — Ambulatory Visit: Admit: 2021-11-28 | Discharge status: Absent without leave | Payer: BC Managed Care – PPO

## 2021-11-29 ENCOUNTER — Encounter: Payer: Self-pay | Admitting: Emergency Medicine

## 2021-11-29 ENCOUNTER — Ambulatory Visit: Payer: Self-pay

## 2021-11-29 ENCOUNTER — Ambulatory Visit
Admission: EM | Admit: 2021-11-29 | Discharge: 2021-11-29 | Disposition: A | Discharge status: Home / Self Care | Payer: BC Managed Care – PPO | Attending: Family Medicine | Admitting: Family Medicine

## 2021-11-29 ENCOUNTER — Other Ambulatory Visit: Payer: Self-pay

## 2021-11-29 DIAGNOSIS — W57XXXA Bitten or stung by nonvenomous insect and other nonvenomous arthropods, initial encounter: Secondary | ICD-10-CM

## 2021-11-29 DIAGNOSIS — L03116 Cellulitis of left lower limb: Secondary | ICD-10-CM

## 2021-11-29 DIAGNOSIS — S80262A Insect bite (nonvenomous), left knee, initial encounter: Secondary | ICD-10-CM | POA: Diagnosis not present

## 2021-11-29 MED ORDER — AMOXICILLIN-POT CLAVULANATE 875-125 MG PO TABS: 1.0000 | ORAL_TABLET | Freq: Two times a day (BID) | ORAL | 0 refills | Status: AC

## 2021-11-29 MED ORDER — CEFTRIAXONE SODIUM 1 G IJ SOLR
1.0000 g | Freq: Once | INTRAMUSCULAR | Status: AC
  Administered 2021-11-29: 1 g via INTRAMUSCULAR

## 2021-11-29 NOTE — ED Provider Notes (Signed)
?  [facility] ? ? ?[id number] ?11/29/21 Arrival Time: 0827 ? ?ASSESSMENT & PLAN: ? ?1. Tick bite of left knee, initial encounter   ?2. Cellulitis of left lower extremity   ? ? ?Meds ordered this encounter  ?Medications  ? cefTRIAXone (ROCEPHIN) injection 1 g  ? amoxicillin-clavulanate (AUGMENTIN) 875-125 MG tablet  ?  Sig: Take 1 tablet by mouth every 12 (twelve) hours.  ?  Dispense:  20 tablet  ?  Refill:  0  ? ?No sign of abscess formation. No retained FB appreciated. ?Tylenol/ibup as needed. ? ?Will follow up with PCP or here if worsening or failing to improve as anticipated. ?Reviewed expectations re: course of current medical issues. Questions answered. ?Outlined signs and symptoms indicating need for more acute intervention. ?Patient verbalized understanding. ?After Visit Summary given. ? ? ?SUBJECTIVE: ? ?Amy Frazier is a 56 y.o. female who presents with a skin complaint. Reports removing non-engorged tick few d ago; with increasing redness/warmth. Afebrile. Does feel fatigued. No n/v. Otherwise well. ?No tx PTA. ? ?OBJECTIVE: ?Vitals:  ? 11/29/21 0936 11/29/21 0937  ?BP: (!) 154/86   ?Pulse: 75   ?Resp: 18   ?Temp: 98.6 ?F (37 ?C)   ?TempSrc: Oral   ?SpO2: 98%   ?Weight:  98.4 kg  ?Height:  5\' 5"  (1.651 m)  ?  ?General appearance: alert; no distress ?HEENT: [place]; AT ?Neck: supple with FROM ?Lungs: clear to auscultation bilaterally ?Heart: regular ?Extremities: no edema; moves all extremities normally ?Skin: warm and dry; inner LEFT knee with small induration; area of erythema extending from this area measuring approx 4 x 5 inches; hot to touch; no areas of fluctuance ?Psychological: alert and cooperative; normal mood and affect ? ?No Known Allergies ? ?Past Medical History:  ?Diagnosis Date  ? Anxiety   ? Hypertension   ? Obesity   ? PCO (polycystic ovaries) 1999  ? ?Social History  ? ?Socioeconomic History  ? Marital status: Married  ?  Spouse name: -22 years   ? Number of children: 0  ?  Years of education: 35  ? Highest education level: 12th grade  ?Occupational History  ? Occupation: 14   ?Tobacco Use  ? Smoking status: Never  ? Smokeless tobacco: Never  ?Substance and Sexual Activity  ? Alcohol use: No  ? Drug use: No  ? Sexual activity: Yes  ?  Partners: Male  ?  Birth control/protection: None  ?Other Topics Concern  ? Not on file  ?Social History Narrative  ? Lives alone with husband married for 22 years   ? ?Social Determinants of Health  ? ?Financial Resource Strain: Not on file  ?Food Insecurity: Not on file  ?Transportation Needs: Not on file  ?Physical Activity: Not on file  ?Stress: Not on file  ?Social Connections: Not on file  ?Intimate Partner Violence: Not on file  ? ?Family History  ?Problem Relation Age of Onset  ? Diabetes Mother   ? Obesity Mother   ? Heart disease Mother   ? Hypertension Father   ? ?Past Surgical History:  ?Procedure Laterality Date  ? ECTOPIC PREGNANCY SURGERY    ? ? ?  ?Quarry manager, MD ?11/29/21 1211 ? ?

## 2021-11-29 NOTE — ED Triage Notes (Signed)
Pt reports removed small tick from medial side of left knee. Pt reports several days later redness,swelling began. Pt reports "unsure if got head out."  ?

## 2021-11-29 NOTE — Discharge Instructions (Signed)
Meds ordered this encounter  Medications   cefTRIAXone (ROCEPHIN) injection 1 g   amoxicillin-clavulanate (AUGMENTIN) 875-125 MG tablet    Sig: Take 1 tablet by mouth every 12 (twelve) hours.    Dispense:  20 tablet    Refill:  0    

## 2021-12-13 ENCOUNTER — Other Ambulatory Visit: Payer: Self-pay | Admitting: Family Medicine

## 2021-12-13 DIAGNOSIS — E559 Vitamin D deficiency, unspecified: Secondary | ICD-10-CM

## 2021-12-19 ENCOUNTER — Ambulatory Visit (INDEPENDENT_AMBULATORY_CARE_PROVIDER_SITE_OTHER): Payer: BC Managed Care – PPO | Admitting: Family Medicine

## 2021-12-19 ENCOUNTER — Encounter: Payer: Self-pay | Admitting: Family Medicine

## 2021-12-19 VITALS — BP 122/68 | HR 93 | Ht 65.0 in | Wt 215.8 lb

## 2021-12-19 DIAGNOSIS — R350 Frequency of micturition: Secondary | ICD-10-CM

## 2021-12-19 DIAGNOSIS — N3090 Cystitis, unspecified without hematuria: Secondary | ICD-10-CM | POA: Diagnosis not present

## 2021-12-19 DIAGNOSIS — Z1211 Encounter for screening for malignant neoplasm of colon: Secondary | ICD-10-CM

## 2021-12-19 LAB — POCT URINALYSIS DIP (CLINITEK)
Bilirubin, UA: NEGATIVE
Blood, UA: NEGATIVE
Glucose, UA: NEGATIVE mg/dL
Ketones, POC UA: NEGATIVE mg/dL
Leukocytes, UA: NEGATIVE
Nitrite, UA: NEGATIVE
POC PROTEIN,UA: NEGATIVE
Spec Grav, UA: 1.02 (ref 1.010–1.025)
Urobilinogen, UA: 0.2 E.U./dL
pH, UA: 5.5 (ref 5.0–8.0)

## 2021-12-19 MED ORDER — SULFAMETHOXAZOLE-TRIMETHOPRIM 800-160 MG PO TABS: 1.0000 | ORAL_TABLET | Freq: Two times a day (BID) | ORAL | 0 refills | Status: AC

## 2021-12-19 NOTE — Patient Instructions (Addendum)
I appreciate the opportunity to provide care to you today! ? ?  ?Follow up:  3 months ? ? Please pick up your prescription at the pharmacy ? ?Please complete the full course of the antibiotics  ? ?You can help prevent UTIs by doing the following: ? ?-Avoid holding urine for prolonged periods; this stretches the bladder and causes bacteria to form because bacteria like warm and wet environments to grow ?-Empty the bladder as soon as the need arises.  ?-Empty your bladder soon after intercourse.  ?-Take showers instead of baths ?-Wipe front to back; doing so after urinating and after a bowel movement helps prevent bacteria in the anal region from spreading to the vagina and urethra. ?-Also, drink a full glass of water to help flush bacteria.  ?  ?Please continue to a heart-healthy diet and increase your physical activities. Try to exercise for 41mins at least three times a week.  ? ? ?  ?It was a pleasure to see you and I look forward to continuing to work together on your health and well-being. ?Please do not hesitate to call the office if you need care or have questions about your care. ?  ?Have a wonderful day and week. ?With Gratitude, ?Alvira Monday MSN, FNP-BC  ?

## 2021-12-19 NOTE — Progress Notes (Addendum)
? ?Acute Office Visit ? ?Subjective:  ? ?  ?Patient ID: Amy Frazier, female    DOB: [birth year], 56 y.o.   MRN: [id number] ? ?Chief Complaint  ?Patient presents with  ? Urinary Frequency  ?  Onset of sx on 12/17/2021, frequency and urgency. Started using AZO otc and drinking cranberry juice.   ? ? ?The patient is in today complaining of urgency, frequency, and burning with urination. The onset of symptoms is 12/17/21. She denies fever and chills. She reports using OTC AZO with minor relief.  ? ?Urinary Frequency  ?This is a new problem. The current episode started in the past 7 days. The problem occurs every urination. The problem has been unchanged. The quality of the pain is described as burning. The pain is at a severity of 7/10. The pain is moderate. There has been no fever. There is No history of pyelonephritis. Associated symptoms include chills, flank pain, frequency and urgency. Pertinent negatives include no hematuria or nausea. She has tried home medications for the symptoms. The treatment provided mild relief.  ? ? ? ?  ? ? ?Review of Systems  ?Constitutional:  Positive for chills. Negative for fever, malaise/fatigue and weight loss.  ?HENT:  Negative for congestion and sinus pain.   ?Eyes:  Negative for blurred vision, double vision and photophobia.  ?Respiratory:  Negative for cough and shortness of breath.   ?Cardiovascular:  Negative for chest pain, palpitations and leg swelling.  ?Gastrointestinal:  Negative for abdominal pain, diarrhea and nausea.  ?Genitourinary:  Positive for dysuria, flank pain, frequency and urgency. Negative for hematuria.  ?Musculoskeletal:  Negative for back pain and falls.  ?Neurological:  Negative for dizziness, weakness and headaches.  ?Psychiatric/Behavioral:  Negative for memory loss and suicidal ideas.   ? ? ?   ?Objective:  ?  ?BP 122/68   Pulse 93   Ht 5\' 5"  (1.651 m)   Wt 215 lb 12.8 oz (97.9 kg)   LMP 11/29/2021   SpO2 98%   BMI 35.91 kg/m?  ? ? ?Physical  Exam ?Constitutional:   ?   Appearance: Normal appearance.  ?Cardiovascular:  ?   Rate and Rhythm: Normal rate and regular rhythm.  ?   Pulses: Normal pulses.  ?   Heart sounds: Normal heart sounds.  ?Pulmonary:  ?   Effort: Pulmonary effort is normal.  ?   Breath sounds: Normal breath sounds.  ?Abdominal:  ?   General: Bowel sounds are normal.  ?   Tenderness: There is no abdominal tenderness. There is no right CVA tenderness or left CVA tenderness.  ?Neurological:  ?   Mental Status: She is alert.  ? ? ?Results for orders placed or performed in visit on 12/19/21  ?POCT URINALYSIS DIP (CLINITEK)  ?Result Value Ref Range  ? Color, UA yellow yellow  ? Clarity, UA clear clear  ? Glucose, UA negative negative mg/dL  ? Bilirubin, UA negative negative  ? Ketones, POC UA negative negative mg/dL  ? Spec Grav, UA 1.020 1.010 - 1.025  ? Blood, UA negative negative  ? pH, UA 5.5 5.0 - 8.0  ? POC PROTEIN,UA negative negative, trace  ? Urobilinogen, UA 0.2 0.2 or 1.0 E.U./dL  ? Nitrite, UA Negative Negative  ? Leukocytes, UA Negative Negative  ? ? ? ?   ?Assessment & Plan:  ? ?Problem List Items Addressed This Visit   ? ?  ? Genitourinary  ? Cystitis  ?  Urine dipstick is negative for leuk and nitrates ?  Will treat since the patient is symptomatic ?Antibiotics sent ? ?Please complete the full course of the antibiotics  ? ?You can help prevent UTIs by doing the following: ? ?-Avoid holding urine for prolonged periods; this stretches the bladder and causes bacteria to form because bacteria like warm and wet environments to grow ?-Empty the bladder as soon as the need arises.  ?-Empty your bladder soon after intercourse.  ?-Take showers instead of baths ?-Wipe front to back; doing so after urinating and after a bowel movement helps prevent bacteria in the anal region from spreading to the vagina and urethra. ?-Also, drink a full glass of water to help flush bacteria. ? ? ?  ?  ? Relevant Medications  ? sulfamethoxazole-trimethoprim  (BACTRIM DS) 800-160 MG tablet  ? ?Other Visit Diagnoses   ? ? Colon cancer screening    -  Primary  ? Relevant Orders  ? Cologuard  ? Urinary frequency      ? Relevant Orders  ? POCT URINALYSIS DIP (CLINITEK) (Completed)  ? ?  ? ? ?Meds ordered this encounter  ?Medications  ? sulfamethoxazole-trimethoprim (BACTRIM DS) 800-160 MG tablet  ?  Sig: Take 1 tablet by mouth 2 (two) times daily for 7 days.  ?  Dispense:  14 tablet  ?  Refill:  0  ? ? ?Return in about 3 months (around 03/21/2022). ? ?Gilmore Laroche, FNP ? ? ?

## 2021-12-19 NOTE — Assessment & Plan Note (Signed)
Urine dipstick is negative for leuk and nitrates ?Will treat since the patient is symptomatic ?Antibiotics sent ? ?Please complete the full course of the antibiotics  ? ?You can help prevent UTIs by doing the following: ? ?-Avoid holding urine for prolonged periods; this stretches the bladder and causes bacteria to form because bacteria like warm and wet environments to grow ?-Empty the bladder as soon as the need arises.  ?-Empty your bladder soon after intercourse.  ?-Take showers instead of baths ?-Wipe front to back; doing so after urinating and after a bowel movement helps prevent bacteria in the anal region from spreading to the vagina and urethra. ?-Also, drink a full glass of water to help flush bacteria. ? ? ?

## 2021-12-20 ENCOUNTER — Other Ambulatory Visit: Payer: Self-pay | Admitting: Family Medicine

## 2021-12-21 ENCOUNTER — Ambulatory Visit: Payer: BC Managed Care – PPO | Admitting: Family Medicine

## 2022-01-05 DIAGNOSIS — Z1211 Encounter for screening for malignant neoplasm of colon: Secondary | ICD-10-CM | POA: Diagnosis not present

## 2022-01-15 LAB — COLOGUARD: COLOGUARD: NEGATIVE

## 2022-01-15 NOTE — Progress Notes (Signed)
Please inform the patient that his cologurad is negative for colon cancer.

## 2022-02-06 ENCOUNTER — Other Ambulatory Visit: Payer: Self-pay

## 2022-03-11 ENCOUNTER — Other Ambulatory Visit: Payer: Self-pay | Admitting: Family Medicine

## 2022-03-11 DIAGNOSIS — E559 Vitamin D deficiency, unspecified: Secondary | ICD-10-CM

## 2022-03-14 ENCOUNTER — Other Ambulatory Visit: Payer: Self-pay

## 2022-03-14 DIAGNOSIS — Z1231 Encounter for screening mammogram for malignant neoplasm of breast: Secondary | ICD-10-CM

## 2022-03-15 ENCOUNTER — Other Ambulatory Visit: Payer: Self-pay | Admitting: Family Medicine

## 2022-03-15 DIAGNOSIS — I10 Essential (primary) hypertension: Secondary | ICD-10-CM

## 2022-03-18 ENCOUNTER — Ambulatory Visit (HOSPITAL_COMMUNITY): Payer: BC Managed Care – PPO

## 2022-03-22 ENCOUNTER — Ambulatory Visit: Payer: BC Managed Care – PPO | Admitting: Family Medicine

## 2022-06-08 ENCOUNTER — Other Ambulatory Visit: Payer: Self-pay | Admitting: Family Medicine

## 2022-06-08 DIAGNOSIS — E559 Vitamin D deficiency, unspecified: Secondary | ICD-10-CM

## 2022-09-02 ENCOUNTER — Other Ambulatory Visit: Payer: Self-pay | Admitting: Family Medicine

## 2022-09-02 DIAGNOSIS — E559 Vitamin D deficiency, unspecified: Secondary | ICD-10-CM

## 2022-09-06 ENCOUNTER — Other Ambulatory Visit: Payer: Self-pay | Admitting: Family Medicine

## 2022-09-06 DIAGNOSIS — E559 Vitamin D deficiency, unspecified: Secondary | ICD-10-CM

## 2022-09-06 MED ORDER — VITAMIN D (ERGOCALCIFEROL) 1.25 MG (50000 UNIT) PO CAPS: 50000.0000 [IU] | ORAL_CAPSULE | ORAL | 0 refills | Status: DC

## 2022-09-11 ENCOUNTER — Other Ambulatory Visit: Payer: Self-pay | Admitting: Family Medicine

## 2022-09-11 DIAGNOSIS — I10 Essential (primary) hypertension: Secondary | ICD-10-CM

## 2022-10-03 ENCOUNTER — Encounter: Payer: Self-pay | Admitting: Radiology

## 2022-12-04 ENCOUNTER — Other Ambulatory Visit: Payer: Self-pay | Admitting: Family Medicine

## 2022-12-04 DIAGNOSIS — E559 Vitamin D deficiency, unspecified: Secondary | ICD-10-CM

## 2023-03-15 ENCOUNTER — Other Ambulatory Visit: Payer: Self-pay | Admitting: Family Medicine

## 2023-03-15 DIAGNOSIS — I10 Essential (primary) hypertension: Secondary | ICD-10-CM

## 2023-03-20 ENCOUNTER — Other Ambulatory Visit: Payer: Self-pay | Admitting: Family Medicine

## 2023-03-20 DIAGNOSIS — E559 Vitamin D deficiency, unspecified: Secondary | ICD-10-CM

## 2023-07-31 ENCOUNTER — Other Ambulatory Visit: Payer: Self-pay | Admitting: Family Medicine

## 2023-07-31 DIAGNOSIS — E559 Vitamin D deficiency, unspecified: Secondary | ICD-10-CM

## 2023-09-25 ENCOUNTER — Other Ambulatory Visit: Payer: Self-pay | Admitting: Family Medicine

## 2023-09-25 DIAGNOSIS — I10 Essential (primary) hypertension: Secondary | ICD-10-CM

## 2023-10-23 ENCOUNTER — Other Ambulatory Visit: Payer: Self-pay | Admitting: Family Medicine

## 2023-10-23 DIAGNOSIS — E559 Vitamin D deficiency, unspecified: Secondary | ICD-10-CM

## 2024-04-07 ENCOUNTER — Other Ambulatory Visit: Payer: Self-pay | Admitting: Family Medicine

## 2024-04-07 DIAGNOSIS — E559 Vitamin D deficiency, unspecified: Secondary | ICD-10-CM

## 2024-04-09 ENCOUNTER — Other Ambulatory Visit: Payer: Self-pay | Admitting: Family Medicine

## 2024-04-09 DIAGNOSIS — E038 Other specified hypothyroidism: Secondary | ICD-10-CM

## 2024-04-09 DIAGNOSIS — R7301 Impaired fasting glucose: Secondary | ICD-10-CM

## 2024-04-09 DIAGNOSIS — E559 Vitamin D deficiency, unspecified: Secondary | ICD-10-CM

## 2024-04-09 DIAGNOSIS — E7849 Other hyperlipidemia: Secondary | ICD-10-CM

## 2024-04-26 ENCOUNTER — Telehealth: Payer: Self-pay | Admitting: Family Medicine

## 2024-04-26 NOTE — Telephone Encounter (Signed)
 Patient wanting refill on triamterene -hydrochlorothiazide (MAXZIDE-25) 37.5-25 MG tablet [[id number]] I told her she may need appt but I would still send a message and see. Please advise  Thank you

## 2024-04-26 NOTE — Telephone Encounter (Signed)
 Waiting for Gloria's schedule to open up so patient can be seen

## 2024-04-28 NOTE — Telephone Encounter (Signed)
 Scheduled patient LVM and sent MyChart message to inform patient

## 2024-05-03 ENCOUNTER — Ambulatory Visit: Admitting: Family Medicine

## 2024-05-03 ENCOUNTER — Encounter: Payer: Self-pay | Admitting: Family Medicine

## 2024-05-03 VITALS — BP 139/82 | HR 94 | Resp 18 | Ht 65.0 in | Wt 217.1 lb

## 2024-05-03 DIAGNOSIS — Z1231 Encounter for screening mammogram for malignant neoplasm of breast: Secondary | ICD-10-CM

## 2024-05-03 DIAGNOSIS — Z23 Encounter for immunization: Secondary | ICD-10-CM | POA: Diagnosis not present

## 2024-05-03 DIAGNOSIS — R41840 Attention and concentration deficit: Secondary | ICD-10-CM | POA: Diagnosis not present

## 2024-05-03 DIAGNOSIS — E559 Vitamin D deficiency, unspecified: Secondary | ICD-10-CM | POA: Diagnosis not present

## 2024-05-03 DIAGNOSIS — I10 Essential (primary) hypertension: Secondary | ICD-10-CM

## 2024-05-03 MED ORDER — TRIAMTERENE-HCTZ 37.5-25 MG PO TABS: 1.0000 | ORAL_TABLET | Freq: Every day | ORAL | 1 refills | Status: AC

## 2024-05-03 MED ORDER — VITAMIN D (ERGOCALCIFEROL) 1.25 MG (50000 UNIT) PO CAPS: 50000.0000 [IU] | ORAL_CAPSULE | ORAL | 1 refills | Status: AC

## 2024-05-03 NOTE — Progress Notes (Unsigned)
 Established Patient Office Visit  Subjective:  Patient ID: Amy Frazier, female    DOB: 01-09-1966  Age: 58 y.o. MRN: [id number]  CC:  Chief Complaint  Patient presents with   Medical Management of Chronic Issues    Follow up/med refill    Concentration     Pt has ongoing concerns about trouble concentrating and focusing, discus possible adhd    HPI Amy Frazier is a 58 y.o. female with past medical history of hypertension, obesity presents for f/u of  chronic medical conditions with the above complaints. For the details of today's visit, please refer to the assessment and plan.    Past Medical History:  Diagnosis Date   Anxiety    Hypertension    Obesity    PCO (polycystic ovaries) 1999    Past Surgical History:  Procedure Laterality Date   ECTOPIC PREGNANCY SURGERY      Family History  Problem Relation Age of Onset   Diabetes Mother    Obesity Mother    Heart disease Mother    Cancer Mother    Hypertension Father     Social History   Socioeconomic History   Marital status: Married    Spouse name: Simmie -22 years    Number of children: 0   Years of education: 12   Highest education level: 12th grade  Occupational History   Occupation: Quarry manager   Tobacco Use   Smoking status: Never   Smokeless tobacco: Never  Substance and Sexual Activity   Alcohol use: No   Drug use: No   Sexual activity: Yes    Partners: Male    Birth control/protection: None  Other Topics Concern   Not on file  Social History Narrative   Lives alone with husband married for 22 years    Social Drivers of Corporate investment banker Strain: Medium Risk (09/10/2018)   Overall Financial Resource Strain (CARDIA)    Difficulty of Paying Living Expenses: Somewhat hard  Food Insecurity: Food Insecurity Present (09/10/2018)   Hunger Vital Sign    Worried About Running Out of Food in the Last Year: Sometimes true    Ran Out of Food in the Last Year: Sometimes true   Transportation Needs: No Transportation Needs (09/10/2018)   PRAPARE - Administrator, Civil Service (Medical): No    Lack of Transportation (Non-Medical): No  Physical Activity: Inactive (09/10/2018)   Exercise Vital Sign    Days of Exercise per Week: 0 days    Minutes of Exercise per Session: 0 min  Stress: No Stress Concern Present (09/10/2018)   [employer] of Occupational Health - Occupational Stress Questionnaire    Feeling of Stress : Only a little  Social Connections: Moderately Integrated (09/10/2018)   Social Connection and Isolation Panel    Frequency of Communication with Friends and Family: More than three times a week    Frequency of Social Gatherings with Friends and Family: More than three times a week    Attends Religious Services: 1 to 4 times per year    Active Member of [employer] or Organizations: No    Attends Banker Meetings: Never    Marital Status: Married  Catering manager Violence: Not At Risk (09/10/2018)   Humiliation, Afraid, Rape, and Kick questionnaire    Fear of Current or Ex-Partner: No    Emotionally Abused: No    Physically Abused: No    Sexually Abused: No    Outpatient Medications Prior to  Visit  Medication Sig Dispense Refill   triamterene -hydrochlorothiazide (MAXZIDE-25) 37.5-25 MG tablet TAKE 1 TABLET BY MOUTH EVERY DAY 90 tablet 1   Vitamin D , Ergocalciferol , (DRISDOL ) 1.25 MG (50000 UNIT) CAPS capsule TAKE 1 CAPSULE (50,000 UNITS TOTAL) BY MOUTH EVERY 7 (SEVEN) DAYS 12 capsule 0   amoxicillin -clavulanate (AUGMENTIN ) 875-125 MG tablet Take 1 tablet by mouth every 12 (twelve) hours. (Patient not taking: Reported on 12/19/2021) 20 tablet 0   Diclofenac -miSOPROStol  50-0.2 MG TBEC Take 1 tablet by mouth 2 (two) times daily. (Patient not taking: Reported on 05/03/2024) 60 tablet 2   No facility-administered medications prior to visit.    No Known Allergies  ROS Review of Systems  Constitutional:  Negative for chills and  fever.  Eyes:  Negative for visual disturbance.  Respiratory:  Negative for chest tightness and shortness of breath.   Neurological:  Negative for dizziness and headaches.      Objective:    Physical Exam HENT:     Head: Normocephalic.     Mouth/Throat:     Mouth: Mucous membranes are moist.  Cardiovascular:     Rate and Rhythm: Normal rate.     Heart sounds: Normal heart sounds.  Pulmonary:     Effort: Pulmonary effort is normal.     Breath sounds: Normal breath sounds.  Neurological:     Mental Status: She is alert.     BP 139/82   Pulse 94   Resp 18   Ht 5' 5 (1.651 m)   Wt 217 lb 1.9 oz (98.5 kg)   SpO2 99%   BMI 36.13 kg/m  Wt Readings from Last 3 Encounters:  05/03/24 217 lb 1.9 oz (98.5 kg)  12/19/21 215 lb 12.8 oz (97.9 kg)  11/29/21 217 lb (98.4 kg)    Lab Results  Component Value Date   TSH 1.390 05/03/2024   Lab Results  Component Value Date   WBC 5.9 05/03/2024   HGB 13.1 05/03/2024   HCT 40.1 05/03/2024   MCV 87 05/03/2024   PLT 279 05/03/2024   Lab Results  Component Value Date   NA 140 05/03/2024   K 3.5 05/03/2024   CO2 23 05/03/2024   GLUCOSE 100 (H) 05/03/2024   BUN 12 05/03/2024   CREATININE 0.79 05/03/2024   BILITOT 0.3 05/03/2024   ALKPHOS 89 05/03/2024   AST 21 05/03/2024   ALT 25 05/03/2024   PROT 7.2 05/03/2024   ALBUMIN 4.2 05/03/2024   CALCIUM 9.5 05/03/2024   EGFR 87 05/03/2024   Lab Results  Component Value Date   CHOL 213 (H) 05/03/2024   Lab Results  Component Value Date   HDL 42 05/03/2024   Lab Results  Component Value Date   LDLCALC 134 (H) 05/03/2024   Lab Results  Component Value Date   TRIG 205 (H) 05/03/2024   Lab Results  Component Value Date   CHOLHDL 5.1 (H) 05/03/2024   Lab Results  Component Value Date   HGBA1C 6.4 (H) 05/03/2024      Assessment & Plan:  Inattention Assessment & Plan: Provided options for obtaining neuropsychiatric testing to help determine whether you have  ADD/ADHD. You may contact Stephane Prescott, NP at [facility] at [phone]. [facility] also offers testing and has multiple providers available; they can be reached at [phone] or [phone]. Another option is [facility] at [phone], where several providers specialize in ADHD and Bipolar disorder. Notably, Delon Dimitri, PhD, has expertise in Adult ADHD,  and Kala Annambhotla, PhD, treats adults with both ADHD and Bipolar disorder.    Essential hypertension Assessment & Plan: Controlled Encouraged to continue treatment regimen as is Low sodium diet with increased physical activity encouraged BP Readings from Last 3 Encounters:  05/03/24 139/82  12/19/21 122/68  11/29/21 (!) 154/86     Orders: -     Triamterene -HCTZ; Take 1 tablet by mouth daily.  Dispense: 90 tablet; Refill: 1  Encounter for immunization Assessment & Plan: Patient educated on CDC recommendation for the vaccine. Verbal consent was obtained from the patient, vaccine administered by nurse, no sign of adverse reactions noted at this time. Patient education on arm soreness and use of tylenol  or ibuprofen for this patient  was discussed. Patient educated on the signs and symptoms of adverse effect and advise to contact the office if they occur.   Orders: -     Flu vaccine trivalent PF, 6mos and older(Flulaval,Afluria,Fluarix,Fluzone)  Breast cancer screening by mammogram -     3D Screening Mammogram, Left and Right  Vitamin D  deficiency -     Vitamin D  (Ergocalciferol ); Take 1 capsule (50,000 Units total) by mouth every 7 (seven) days.  Dispense: 27 capsule; Refill: 1  Note: This chart has been completed using Engineer, civil (consulting) software, and while attempts have been made to ensure accuracy, certain words and phrases may not be transcribed as intended.    Follow-up: Return in about 1 month (around 06/02/2024) for pap smear.    Hattie Pine  Z Bacchus, FNP

## 2024-05-03 NOTE — Patient Instructions (Addendum)
 I appreciate the opportunity to provide care to you today!    Follow up:  1 months for pap smear  Fasting Labs: please stop by the lab during the week to get your blood drawn (CBC, CMP, TSH, Lipid profile, HgA1c, Vit D)  Screening: HIV and Hep C  Schedule mammogram  For a Healthier YOU, I Recommend: Reducing your intake of sugar, sodium, carbohydrates, and saturated fats. Increasing your fiber intake by incorporating more whole grains, fruits, and vegetables into your meals. Setting healthy goals with a focus on lowering your consumption of carbs, sugar, and unhealthy fats. Adding variety to your diet by including a wide range of fruits and vegetables. Cutting back on soda and limiting processed foods as much as possible. Staying active: In addition to taking your weight loss medication, aim for at least 150 minutes of moderate-intensity physical activity each week for optimal results.  Please follow up if your symptoms worsen or fail to improve.  Attached with your AVS, you will find valuable resources for self-education. I highly recommend dedicating some time to thoroughly examine them.   Please continue to a heart-healthy diet and increase your physical activities. Try to exercise for at least five days a week.    It was a pleasure to see you and I look forward to continuing to work together on your health and well-being. Please do not hesitate to call the office if you need care or have questions about your care.  In case of emergency, please visit the Emergency Department for urgent care, or contact our clinic at [phone] to schedule an appointment. We're here to help you!   Have a wonderful day and week. With Gratitude, Duayne Brideau MSN, FNP-BC

## 2024-05-04 ENCOUNTER — Ambulatory Visit: Payer: Self-pay | Admitting: Family Medicine

## 2024-05-04 DIAGNOSIS — E7849 Other hyperlipidemia: Secondary | ICD-10-CM

## 2024-05-04 LAB — CMP14+EGFR
ALT: 25 IU/L (ref 0–32)
AST: 21 IU/L (ref 0–40)
Albumin: 4.2 g/dL (ref 3.8–4.9)
Alkaline Phosphatase: 89 IU/L (ref 49–135)
BUN/Creatinine Ratio: 15 (ref 9–23)
BUN: 12 mg/dL (ref 6–24)
Bilirubin Total: 0.3 mg/dL (ref 0.0–1.2)
CO2: 23 mmol/L (ref 20–29)
Calcium: 9.5 mg/dL (ref 8.7–10.2)
Chloride: 102 mmol/L (ref 96–106)
Creatinine, Ser: 0.79 mg/dL (ref 0.57–1.00)
Globulin, Total: 3 g/dL (ref 1.5–4.5)
Glucose: 100 mg/dL — ABNORMAL HIGH (ref 70–99)
Potassium: 3.5 mmol/L (ref 3.5–5.2)
Sodium: 140 mmol/L (ref 134–144)
Total Protein: 7.2 g/dL (ref 6.0–8.5)
eGFR: 87 mL/min/1.73 (ref >59)

## 2024-05-04 LAB — TSH+FREE T4
Free T4: 0.85 ng/dL (ref 0.82–1.77)
TSH: 1.39 u[IU]/mL (ref 0.450–4.500)

## 2024-05-04 LAB — CBC WITH DIFFERENTIAL/PLATELET
Basophils Absolute: 0.1 x10E3/uL (ref 0.0–0.2)
Basos: 1 %
EOS (ABSOLUTE): 0.1 x10E3/uL (ref 0.0–0.4)
Eos: 1 %
Hematocrit: 40.1 % (ref 34.0–46.6)
Hemoglobin: 13.1 g/dL (ref 11.1–15.9)
Immature Grans (Abs): 0.1 x10E3/uL (ref 0.0–0.1)
Immature Granulocytes: 1 %
Lymphocytes Absolute: 2.8 x10E3/uL (ref 0.7–3.1)
Lymphs: 48 %
MCH: 28.4 pg (ref 26.6–33.0)
MCHC: 32.7 g/dL (ref 31.5–35.7)
MCV: 87 fL (ref 79–97)
Monocytes Absolute: 0.5 x10E3/uL (ref 0.1–0.9)
Monocytes: 8 %
Neutrophils Absolute: 2.4 x10E3/uL (ref 1.4–7.0)
Neutrophils: 41 %
Platelets: 279 x10E3/uL (ref 150–450)
RBC: 4.61 x10E6/uL (ref 3.77–5.28)
RDW: 13.8 % (ref 11.7–15.4)
WBC: 5.9 x10E3/uL (ref 3.4–10.8)

## 2024-05-04 LAB — LIPID PANEL
Chol/HDL Ratio: 5.1 ratio — ABNORMAL HIGH (ref 0.0–4.4)
Cholesterol, Total: 213 mg/dL — ABNORMAL HIGH (ref 100–199)
HDL: 42 mg/dL (ref >39)
LDL Chol Calc (NIH): 134 mg/dL — ABNORMAL HIGH (ref 0–99)
Triglycerides: 205 mg/dL — ABNORMAL HIGH (ref 0–149)
VLDL Cholesterol Cal: 37 mg/dL (ref 5–40)

## 2024-05-04 LAB — HEMOGLOBIN A1C
Est. average glucose Bld gHb Est-mCnc: 137 mg/dL
Hgb A1c MFr Bld: 6.4 % — ABNORMAL HIGH (ref 4.8–5.6)

## 2024-05-04 LAB — VITAMIN D 25 HYDROXY (VIT D DEFICIENCY, FRACTURES): Vit D, 25-Hydroxy: 26.7 ng/mL — ABNORMAL LOW (ref 30.0–100.0)

## 2024-05-04 MED ORDER — ROSUVASTATIN CALCIUM 10 MG PO TABS: 10.0000 mg | ORAL_TABLET | Freq: Every day | ORAL | 1 refills | Status: AC

## 2024-05-06 DIAGNOSIS — R41840 Attention and concentration deficit: Secondary | ICD-10-CM | POA: Insufficient documentation

## 2024-05-06 DIAGNOSIS — Z23 Encounter for immunization: Secondary | ICD-10-CM | POA: Insufficient documentation

## 2024-05-06 NOTE — Assessment & Plan Note (Signed)
 Controlled Encouraged to continue treatment regimen as is Low sodium diet with increased physical activity encouraged BP Readings from Last 3 Encounters:  05/03/24 139/82  12/19/21 122/68  11/29/21 (!) 154/86

## 2024-05-06 NOTE — Assessment & Plan Note (Signed)
 Patient educated on CDC recommendation for the vaccine. Verbal consent was obtained from the patient, vaccine administered by nurse, no sign of adverse reactions noted at this time. Patient education on arm soreness and use of tylenol or ibuprofen for this patient  was discussed. Patient educated on the signs and symptoms of adverse effect and advise to contact the office if they occur.

## 2024-05-06 NOTE — Assessment & Plan Note (Signed)
 Provided options for obtaining neuropsychiatric testing to help determine whether you have ADD/ADHD. You may contact Stephane Prescott, NP at [facility] at [phone]. [facility] also offers testing and has multiple providers available; they can be reached at [phone] or [phone]. Another option is [facility] at [phone], where several providers specialize in ADHD and Bipolar disorder. Notably, Delon Dimitri, PhD, has expertise in Adult ADHD, and Jackson Fredrickson, PhD, treats adults with both ADHD and Bipolar disorder.

## 2024-05-10 ENCOUNTER — Encounter (HOSPITAL_COMMUNITY): Payer: Self-pay | Admitting: Radiology

## 2024-05-10 ENCOUNTER — Ambulatory Visit (HOSPITAL_COMMUNITY)
Admission: RE | Admit: 2024-05-10 | Discharge: 2024-05-10 | Disposition: A | Discharge status: Home / Self Care | Source: Ambulatory Visit | Attending: Family Medicine | Admitting: Family Medicine

## 2024-05-10 DIAGNOSIS — Z1231 Encounter for screening mammogram for malignant neoplasm of breast: Secondary | ICD-10-CM | POA: Insufficient documentation

## 2024-05-11 ENCOUNTER — Inpatient Hospital Stay
Admission: RE | Admit: 2024-05-11 | Discharge: 2024-05-11 | Disposition: A | Payer: Self-pay | Source: Ambulatory Visit | Attending: Family Medicine

## 2024-05-11 ENCOUNTER — Inpatient Hospital Stay
Admission: RE | Admit: 2024-05-11 | Discharge: 2024-05-11 | Disposition: A | Discharge status: Home / Self Care | Payer: Self-pay | Source: Ambulatory Visit | Attending: Family Medicine | Admitting: Family Medicine

## 2024-05-11 ENCOUNTER — Other Ambulatory Visit: Payer: Self-pay | Admitting: Family Medicine

## 2024-05-11 DIAGNOSIS — Z1231 Encounter for screening mammogram for malignant neoplasm of breast: Secondary | ICD-10-CM

## 2024-09-13 ENCOUNTER — Ambulatory Visit: Admitting: Family Medicine

## 2024-09-14 ENCOUNTER — Ambulatory Visit: Admitting: Family Medicine

## 2024-09-30 ENCOUNTER — Ambulatory Visit: Admitting: Family Medicine
